# Patient Record
Sex: Female | Born: 1980 | Race: White | Hispanic: No | Marital: Married | Smoking: Former smoker
Health system: Southern US, Community
[De-identification: ages and names within clinical notes are randomized; demographics above are authoritative.]

## PROBLEM LIST (undated history)

## (undated) DIAGNOSIS — G43909 Migraine, unspecified, not intractable, without status migrainosus: Secondary | ICD-10-CM

## (undated) DIAGNOSIS — G4730 Sleep apnea, unspecified: Secondary | ICD-10-CM

## (undated) HISTORY — DX: Sleep apnea, unspecified: G47.30

## (undated) HISTORY — DX: Migraine, unspecified, not intractable, without status migrainosus: G43.909

## (undated) HISTORY — PX: INTRAUTERINE DEVICE INSERTION: SHX323

---

## 2016-07-26 ENCOUNTER — Ambulatory Visit: Payer: BC Managed Care – PPO | Attending: Obstetrics & Gynecology | Admitting: Physical Therapy

## 2016-07-26 ENCOUNTER — Encounter: Payer: Self-pay | Admitting: Physical Therapy

## 2016-07-26 DIAGNOSIS — M6281 Muscle weakness (generalized): Secondary | ICD-10-CM | POA: Diagnosis not present

## 2016-07-26 DIAGNOSIS — R279 Unspecified lack of coordination: Secondary | ICD-10-CM | POA: Insufficient documentation

## 2016-07-26 NOTE — Patient Instructions (Signed)
About Pelvic Support Problems Pelvic Support Problems Explained Ligaments, muscles, and connective tissue normally hold your bladder, uterus, and other organs in their proper places in your pelvis. When these tissues become weak, a problem with pelvic support may result. Weak support can cause one or more of the pelvic organs to drop down into the vagina. An organ may even drop so far that is partially exposed outside the body.  Pelvic support problems are named by the change in the organ. The main types of pelvic support problems are:  . Cystocele: When the bladder drops down into your vagina.  . Enterocele: When your small intestine drops between your vagina and rectum.  . Rectocele: When your rectum bulges into the vaginal wall.  Marland Kitchen Uterine prolapse: When your uterus drops into your vagina.  . Vaginal prolapse: When the top part of the vagina begins to droop. This sometimes happens after a hysterectomy (removal of the uterus).  Causes Pelvic support problems can be caused by many conditions. They may begin after you give birth, especially if you had a large baby. During childbirth, the muscles and skin of the birth canal (vagina) are stretched and sometimes torn. They heal over time but are not always exactly the same. A long pushing stage of labor may also weaken these tissues as well as very rapid births as the tissues do not have time to stretch so they tear.  Also, after menopause, there are changes in the vaginal walls resulting from a decrease in estrogen. Estrogen helps to keep the tissues toned. Low levels of estrogen weaken the vaginal walls and may cause the bladder to shift from its normal position. As women get older, the loss of muscle tone and the relaxation of muscles may cause the uterus or other organs to drop.  Over time, conditions like chronic coughing, chronic constipation, doing a lot of heavy lifting, straining to pass stool, and obesity, can also weaken the pelvic support  muscles.  Diagnosing Pelvic Support Problems Your health care provider will ask about your symptoms and do a pelvic examination. Your provider may also do a rectal exam during your pelvic exam. Your provider may ask you to: 1. Bear down and push (like you are having a bowel movement) so he or she can see if your bladder or other part of your body protrudes into the vagina. 2. Contract the muscles of your pelvis to check the strength of your pelvic muscles.  3. Do several types of urine, nerve and muscle tests of the pelvis and around the bladder to see what type of treatment is best for you.   Symptoms Symptoms of pelvic support problems depend on the organ involved, but may include:  . urine leakage  . stain or fecal loss after a bowel movement . trouble having bowel movements  . ache in the lower abdomen, groin, or lower back  . bladder infection  . a feeling of heaviness, pulling, or fullness in the pelvis, or a feeling that something is falling out of the vagina  . an organ protruding from your vaginal opening  . feeling the need to support the organs or perineal area to empty bladder or bowels . painful sexual intercourse.  Many women feel pelvic pressure or trouble holding their urine immediately after childbirth. For some, these symptoms go away permanently, in others they return as they get older.  Treatment Options A prolapsed organ cannot repair itself. Contact your health care provider as soon as you notice symptoms  of a problem. Treatment depends on what the specific problem is and how far advanced it is.  . The symptoms caused by some pelvic support problems may simply be treated with changes in diet, medicine to soften the stool, weight loss, or avoiding strenuous activities. You may also do pelvic floor exercises to help strengthen your pelvic muscles.  . Some cases of prolapse may require a special support device made from plastic or rubber called a pessary that fits into the  vagina to support the uterus, vagina, or bladder. A pessary can also help women who leak urine when coughing, straining, or exercising. In mild cases, a tampon or vaginal diaphragm may be used instead of a pessary.  Talk to your doctor or health care provider about these options. . In serious cases, surgery may be needed to put the organs back into their proper place. The uterus may be removed because of the pressure it puts on the bladder.  Your doctor will know what surgery will be best for you. How can I prevent pelvic support problems?  You can help prevent pelvic support problems by:  . maintaining a healthy lifestyle  . continuing to do pelvic floor exercises after you deliver a baby  . maintaining a healthy weight  . avoiding a lot of heavy lifting and lifting with your legs (not from your waist)  treating constipation and   About Cystocele Overview The pelvic organs, including the bladder, are normally supported by pelvic floor muscles and ligaments.   When these muscles and ligaments are stretched, weakened or torn, the wall between the bladder and the vagina sags or herniates causing a prolapse, sometimes called a cystocele. This condition may cause discomfort and problems with emptying the bladder.  It can be present in various stages.  Some people are not aware of the changes. Others may notice changes at the vaginal opening or a feeling of the bladder dropping outside the body. Causes of a Cystocele A cystocele is usually caused by muscle straining or stretching during childbirth.  In addition, cystocele is more common after menopause, because the hormone estrogen helps keep the elastic tissues around the pelvic organs strong.  A cystocele is more likely to occur when levels of estrogen decrease. Other causes include: heavy lifting, chronic coughing, previous pelvic surgery and obesity.  Symptoms A bladder that has dropped from its normal position may cause: unwanted urine leakage  (stress incontinence), frequent urination or urge to urinate, incomplete emptying of the bladder (not feeling bladder relief after emptying), pain or discomfort in the vagina, pelvis, groin, lower back or lower abdomen and frequent urinary tract infections.  Mild cases may not cause any symptoms.  Treatment Options . Pelvic floor (Kegel) exercises: Strength training the muscles in your genital area  . Behavioral changes: Treating and preventing constipation, taking time to empty your bladder properly, learning to lift properly and/or  avoid heavy lifting when possible, stopping smoking, avoiding weight gain and treating a chronic cough or bronchitis. . A pessary: A vaginal support device is sometimes used to help pelvic support caused by muscle and ligament changes. . Surgery: Aurgical repair may be necessary if symptoms cannot be managed with exercise, behavioral changes and a pessary. Surgery is usually considered for severe cases. [facility] [address], [address] [place], [place] [address] Phone # [phone] Fax [phone]

## 2016-07-26 NOTE — Therapy (Signed)
[facility] [address] [place], [place], [address] Phone: [phone]   Fax:  [phone]  Physical Therapy Evaluation  Patient Details  Name: Twisha Vanpelt MRN: [id number] Date of Birth: 15-Apr-1981 Referring Provider: Dr. Myna Hidalgo  Encounter Date: 07/26/2016      PT End of Session - 07/26/16 1107    Visit Number 1   Date for PT Re-Evaluation 11/25/16   PT Start Time 0930   PT Stop Time 1010   PT Time Calculation (min) 40 min   Activity Tolerance Patient tolerated treatment well   Behavior During Therapy [facility] for tasks assessed/performed      History reviewed. No pertinent past medical history.  History reviewed. No pertinent surgical history.  There were no vitals filed for this visit.       Subjective Assessment - 07/26/16 0941    Subjective My son born 08/01/2016.  I noticed a problem 6 months ago and things felt weird.  I looked at the vagina and saw things coming out.  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  After the fissure I had urinary urgency. I have cut out caffiene.  Not sure I have fully emptied my bladder. leak urine with sneeze and cough unexpectantly, and yell at dog.    Patient Stated Goals reduce prolapse, reduce urgency, reduce urinary leakage   Currently in Pain? No/denies            [facility] PT Assessment - 07/26/16 0001      Assessment   Medical Diagnosis N81.4 Uerine prolapse   Referring Provider Dr. Myna Hidalgo   Onset Date/Surgical Date 08/01/16   Prior Therapy None     Precautions   Precautions None     Restrictions   Weight Bearing Restrictions No     Balance Screen   Has the patient fallen in the past 6 months No   Has the patient had a decrease in activity level because of a fear of falling?  No   Is the patient reluctant to leave their home because of a fear of falling?  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, poultry, [place], dogs, cats   Leisure yoga 1 time per week     Cognition   Overall Cognitive Status Within Functional Limits for tasks assessed     Observation/Other Assessments   Focus on Therapeutic Outcomes (FOTO)  49% limitation with urinary problem  goal is 36% limitation     Posture/Postural Control   Posture/Postural Control No significant limitations     ROM / Strength   AROM / PROM / Strength AROM;Strength     AROM   Overall AROM  Within functional limits for tasks performed     Strength   Overall Strength Comments right hip abduction and left hip extension 4/5     Palpation   SI assessment  right ilium is anterior rotated; sacrum rotated left;                  Pelvic Floor Special Questions - 07/26/16 0001    Prior Pregnancies Yes   Number of Vaginal Deliveries 1   Diastasis Recti none   Currently Sexually Active Yes   Marinoff Scale no problems   Urinary Leakage Yes   Pad use 0   Activities that  cause leaking Coughing;Sneezing;Other  yelling at dog   Urinary urgency Yes   Caffeine beverages no   Falling out feeling (prolapse) Yes   Activities that cause feeling of prolapse sitting on commode, squatting   Prolapse Anterior Wall  comes to introitus   Pelvic Floor Internal Exam Patient confirms identification and approves PT to assess muscle integrity and strength   Exam Type Vaginal   Palpation tightness along bil. sides of urethra,    Strength good squeeze, good lift, able to hold agaisnt strong resistance          [facility] Adult PT Treatment/Exercise - 07/26/16 0001      Manual Therapy   Manual Therapy Muscle Energy Technique;Joint mobilization   Joint Mobilization corrected sacrum   Muscle Energy Technique correct right ilium;                 PT Education - 07/26/16 1107    Education provided Yes   Education Details information on pelvic  support issues   Person(s) Educated Patient   Methods Explanation;Handout   Comprehension Verbalized understanding          PT Short Term Goals - 07/26/16 1115      PT SHORT TERM GOAL #1   Title independent with initial HEP   Time 4   Period Weeks   Status New     PT SHORT TERM GOAL #2   Title urinary leakage with coughing and sneezing decreased >/= 25%   Time 4   Period Weeks   Status New     PT SHORT TERM GOAL #3   Title urinary leakage with yelling at her field dog decreased >/= 25%   Time 4   Period Weeks   Status New     PT SHORT TERM GOAL #4   Title understand how  valsalva manuever can strain pelvic floor and increase prolapse   Time 4   Period Weeks   Status New     PT SHORT TERM GOAL #5   Title understand ways to manage uterine prolapse to prevent progression   Time 4   Period Weeks   Status New     Additional Short Term Goals   Additional Short Term Goals Yes           PT Long Term Goals - 07/26/16 1117      PT LONG TERM GOAL #1   Title independent with HEP and how to progress herself   Time 4   Period Months   Status New     PT LONG TERM GOAL #2   Title urinary leakage with coughing and sneezing decreased >/= 80% due to increased pelvic floor strength   Time 4   Period Months   Status New     PT LONG TERM GOAL #3   Title urinary leakage with yelling at her field dog decreased >/= 80% due to increased pelvic floor strength   Time 4   Period Weeks   Status New     PT LONG TERM GOAL #4   Title feeling of prolapse reduced to minimal with squatting and sitting on commode   Time 4   Period Months   Status New     PT LONG TERM GOAL #5   Title FOTO score is </= 36% limitation   Time 4   Period Months   Status New               Plan - 07/26/16 1108  Clinical Impression Statement Patient is a 35 year old female with diagnosis of uterine prolapse that began 08/02/2015 when her son was born.  Patient reports no pain.  She  leaks urine with coughing, sneezing and yelling at her field dog.  Patient reports urinary urgency.  Patient has a prolapse just before the intoitus wne bearing down in a hookly position. Patient has decreased mobility of  bladder and urethra. Tightness located on the pubovesical ligaments.  Right ilium is rotated anterior and sacrum is rotated left.  After mobilization and muscle energy technique the pelvis was in correct alignment. Pelvic floor strength is 4/5 and right hip abduction and left hip extension strength is 4/5. Patient is of low complexity. Patient will benefit from physical therapy to improve strength and stability of the pelvis.    Rehab Potential Excellent   Clinical Impairments Affecting Rehab Potential None   PT Frequency 1x / week   PT Duration Other (comment)  4 months   PT Treatment/Interventions ADLs/Self Care Home Management;Neuromuscular re-education;Therapeutic exercise;Therapeutic activities;Patient/family education;Manual techniques   PT Next Visit Plan check on pelvic alignment, pelvic EMG in standing, core strength, discuss no valsalva movement, hip abd and ext strength; soft tissue work   PT Home Exercise Plan progress as needed   Recommended Other Services None   Consulted and Agree with Plan of Care Patient      Patient will benefit from skilled therapeutic intervention in order to improve the following deficits and impairments:  Decreased coordination, Increased fascial restricitons, Decreased endurance, Decreased activity tolerance, Decreased strength  Visit Diagnosis: Muscle weakness (generalized) - Plan: PT plan of care cert/re-cert  Unspecified lack of coordination - Plan: PT plan of care cert/re-cert     Problem List There are no active problems to display for this patient.   Eulis FosterCheryl Gray, PT 07/26/16 11:23 AM   [facility] [address] [place], [place], [address] Phone: [phone]    Fax:  [phone]  Name: Vincent GrosKaela Olarte MRN: [id number] Date of Birth: Dec 10, 1981

## 2016-08-17 ENCOUNTER — Ambulatory Visit: Payer: BC Managed Care – PPO | Admitting: Physical Therapy

## 2016-08-17 ENCOUNTER — Encounter: Payer: Self-pay | Admitting: Physical Therapy

## 2016-08-17 DIAGNOSIS — M6281 Muscle weakness (generalized): Secondary | ICD-10-CM | POA: Diagnosis not present

## 2016-08-17 DIAGNOSIS — R279 Unspecified lack of coordination: Secondary | ICD-10-CM

## 2016-08-17 NOTE — Therapy (Signed)
[name] [place], [place], [address] Phone: [phone]   Fax:  [phone]  Physical Therapy Treatment  Patient Details  Name: Margaret Rich MRN: [id number] Date of Birth: 1981-05-29 Referring Provider: Dr. Myna HidalgoJennifer Ozan  Encounter Date: 08/17/2016      PT End of Session - 08/17/16 1107    Visit Number 2   Date for PT Re-Evaluation 11/25/16   PT Start Time 1107  patient came late   PT Stop Time 1145   PT Time Calculation (min) 38 min   Activity Tolerance Patient tolerated treatment well   Behavior [facility] Therapy [facility] for tasks assessed/performed      History reviewed. No pertinent past medical history.  History reviewed. No pertinent surgical history.  There were no vitals filed for this visit.      Subjective Assessment - 08/17/16 1108    Subjective I did good for awhile.  too many carbonated beverages I leak.   Patient Stated Goals reduce prolapse, reduce urgency, reduce urinary leakage   Currently in Pain? No/denies            [facility] PT Assessment - 08/17/16 0001      Palpation   SI assessment  right ilium is anterior rotated; sacrum rotated left;                      [facility] Treatment/Exercise - 08/17/16 0001      Manual Therapy   Manual Therapy Muscle Energy Technique;Myofascial release;Soft tissue mobilization   Muscle Energy Technique correct right ilium;                 PT Education - 08/17/16 1140    Education provided Yes   Education Details abdominal contraction, pelvic floor contraction, no valsalva maneuver, lifting principles   Person(s) Educated Patient   Methods Explanation;Demonstration;Verbal cues;Handout   Comprehension Returned demonstration;Verbalized understanding          PT Short Term Goals - 08/17/16 1107      PT SHORT TERM GOAL #1   Title independent with initial HEP   Time 4   Period Weeks   Status New     PT SHORT TERM GOAL #2   Title urinary leakage with coughing and sneezing decreased >/= 25%   Time 4   Period Weeks   Status New     PT SHORT TERM GOAL #3   Title urinary leakage with yelling at her field dog decreased >/= 25%   Time 4   Period Weeks   Status New     PT SHORT TERM GOAL #4   Title understand how  valsalva manuever can strain pelvic floor and increase prolapse   Time 4   Period Weeks   Status New     PT SHORT TERM GOAL #5   Title understand ways to manage uterine prolapse to prevent progression   Time 4   Period Weeks   Status New           PT Long Term Goals - 07/26/16 1117      PT LONG TERM GOAL #1   Title independent with HEP and how to progress herself   Time 4   Period Months   Status New     PT LONG TERM GOAL #2   Title urinary leakage with coughing and sneezing decreased >/= 80% due to increased pelvic floor strength   Time 4   Period Months  Status New     PT LONG TERM GOAL #3   Title urinary leakage with yelling at her field dog decreased >/= 80% due to increased pelvic floor strength   Time 4   Period Weeks   Status New     PT LONG TERM GOAL #4   Title feeling of prolapse reduced to minimal with squatting and sitting on commode   Time 4   Period Months   Status New     PT LONG TERM GOAL #5   Title FOTO score is </= 36% limitation   Time 4   Period Months   Status New               Plan - 08/17/16 1140    Clinical Impression Statement Patient pelvis was in correct alignment after therapy. Patient is able to contract her abdominals correctly after the soft tissue work. No change in urinary leakage at this time.  Patient was educated on bladder irritants due to carbonated beverages and alchol cause urinary urgency.  Patient will benefit from skilled therapy to improve pelvic floor coordination.    Rehab Potential Excellent   Clinical Impairments Affecting Rehab Potential None   PT Frequency 1x / week   PT Duration Other  (comment)  4 months   PT Treatment/Interventions ADLs/Self Care Home Management;Neuromuscular re-education;Therapeutic exercise;Therapeutic activities;Patient/family education;Manual techniques   PT Next Visit Plan check on pelvic alignment, pelvic EMG in standing, core strength, , hip abd and ext strength; soft tissue work, review bladder diary   PT Home Exercise Plan progress as needed   Consulted and Agree with Plan of Care Patient      Patient will benefit from skilled therapeutic intervention in order to improve the following deficits and impairments:  Decreased coordination, Increased fascial restricitons, Decreased endurance, Decreased activity tolerance, Decreased strength  Visit Diagnosis: Muscle weakness (generalized)  Unspecified lack of coordination     Problem List There are no active problems to display for this patient.   Margaret Rich, PT 08/17/16 11:44 AM   [facility] [address] [place], [place], [address] Phone: [phone]   Fax:  [phone]  Name: Margaret Rich MRN: [id number] Date of Birth: 11/22/1981

## 2016-08-17 NOTE — Patient Instructions (Addendum)
Certain foods and liquids will decrease the pH making the urine more acidic.  Urinary urgency increases when the urine has a low pH.  Most common irritants: alcohol, carbonated beverages and caffinated beverages.  Foods to avoid: apple juice, apples, ascorbic acid, canteloupes, chili, citrus fruits, coffee, cranberries, grapes, guava, peaches, pepper, pineapple, plums, strawberries, tea, tomatoes, and vinegar.  Drinking plenty of water may help to increase the pH and dilute out any of the effects of specific irritants. Drink half you body weight in ounces per day.   Foods that are NOT irritating to the bladder include: Pears, papayas, sun-brewed teas, watermelons, non-citrus herbal teas, apricots, kava and low-acid instant drinks (Postum)  Adduction: Hip - Knees Together (Hook-Lying)    Lie with hips and knees bent, towel roll between knees. Push knees together. Hold for _10_ seconds. Rest for _5__ seconds. Repeat _10_ times. Do _2__ times a day.   Copyright  VHI. All rights reserved.    Quick Contraction: Gravity Resisted (Sitting)    Sitting, quickly squeeze then fully relax pelvic floor. Perform __1_ sets of _10__. Rest for _1__ seconds between sets. Do __3_ times a day.  Copyright  VHI. All rights reserved.   Slow Contraction: Gravity Resisted (Sitting)    Sitting, slowly squeeze pelvic floor for __10_ seconds. Rest for _5__ seconds. Repeat _10__ times. Do __3_ times a day.  Copyright  VHI. All rights reserved.  Lifting Principles  .Maintain proper posture and head alignment. .Slide object as close as possible before lifting. .Move obstacles out of the way. .Test before lifting; ask for help if too heavy. .Tighten stomach muscles without holding breath. .Use smooth movements; do not jerk. .Use legs to do the work, and pivot with feet. .Distribute the work load symmetrically and close to the center of trunk. .Push instead of pull whenever possible. Do not hold  your breath Copyright  VHI. All rights reserved.  Housework - Vacuuming    Hold the vacuum with arm held at side. Step back and forth to move it, keeping head up. Avoid twisting.   Copyright  VHI. All rights reserved.  [facility] [address], [address] [place], [place] [address] Phone # [phone] Fax [phone]

## 2016-08-24 ENCOUNTER — Encounter: Payer: BC Managed Care – PPO | Admitting: Physical Therapy

## 2016-08-31 ENCOUNTER — Ambulatory Visit: Payer: BC Managed Care – PPO | Attending: Obstetrics & Gynecology | Admitting: Physical Therapy

## 2016-08-31 DIAGNOSIS — M6281 Muscle weakness (generalized): Secondary | ICD-10-CM | POA: Diagnosis not present

## 2016-08-31 DIAGNOSIS — R279 Unspecified lack of coordination: Secondary | ICD-10-CM | POA: Insufficient documentation

## 2016-08-31 NOTE — Therapy (Signed)
[facility] [address], [address] [place], [place], [address] Phone: [phone]   Fax:  [phone]  Physical Therapy Treatment  Patient Details  Name: Vincent GrosKaela Dominy MRN: [id number] Date of Birth: 1981/09/20 Referring Provider: Dr. Myna HidalgoJennifer Ozan  Encounter Date: 08/31/2016      PT End of Session - 08/31/16 1111    Visit Number 3   Date for PT Re-Evaluation 11/25/16   PT Start Time 1111  came 10 min late   PT Stop Time 1145   PT Time Calculation (min) 34 min   Activity Tolerance Patient tolerated treatment well   Behavior During Therapy [facility] for tasks assessed/performed      No past medical history on file.  No past surgical history on file.  There were no vitals filed for this visit.      Subjective Assessment - 08/31/16 1112    Subjective no urinary leakage since last visit.  NO sure about the prolapse if it is better or not. My stomach has changed shape for the good.  I felt better after the internal work.    Patient Stated Goals reduce prolapse, reduce urgency, reduce urinary leakage   Currently in Pain? No/denies                      Pelvic Floor Special Questions - 08/31/16 0001    Pelvic Floor Internal Exam Patient confirms identification and approves PT to assess muscle integrity and strength   Exam Type Vaginal           [facility] Adult PT Treatment/Exercise - 08/31/16 0001      Self-Care   Self-Care Other Self-Care Comments   Other Self-Care Comments  discussed bladder diary and discussed irritants and going to the bathroom every 2-3 hours.      Therapeutic Activites    Therapeutic Activities Other Therapeutic Activities   Other Therapeutic Activities no bearing down with      Manual Therapy   Manual Therapy Internal Pelvic Floor;Myofascial release   Myofascial Release urethra compressor bil. upward glide of bladder, release of urethra    Internal Pelvic Floor soft tissue to bil.  obturator internist , bil. coccygeus, bil. puborectalis                PT Education - 08/31/16 1155    Education provided Yes   Education Details no valsalva manuever with strenous activity   Person(s) Educated Patient   Methods Explanation   Comprehension Verbalized understanding          PT Short Term Goals - 08/31/16 1112      PT SHORT TERM GOAL #1   Title independent with initial HEP   Time 4   Period Weeks   Status Achieved     PT SHORT TERM GOAL #2   Title urinary leakage with coughing and sneezing decreased >/= 25%   Time 4   Period Weeks   Status Achieved     PT SHORT TERM GOAL #3   Title urinary leakage with yelling at her field dog decreased >/= 25%   Time 4   Period Weeks   Status Achieved     PT SHORT TERM GOAL #4   Title understand how  valsalva manuever can strain pelvic floor and increase prolapse   Time 4   Period Weeks   Status Achieved     PT SHORT TERM GOAL #5   Title understand ways to manage uterine prolapse to prevent  progression   Time 4   Period Weeks   Status Achieved           PT Long Term Goals - 07/26/16 1117      PT LONG TERM GOAL #1   Title independent with HEP and how to progress herself   Time 4   Period Months   Status New     PT LONG TERM GOAL #2   Title urinary leakage with coughing and sneezing decreased >/= 80% due to increased pelvic floor strength   Time 4   Period Months   Status New     PT LONG TERM GOAL #3   Title urinary leakage with yelling at her field dog decreased >/= 80% due to increased pelvic floor strength   Time 4   Period Weeks   Status New     PT LONG TERM GOAL #4   Title feeling of prolapse reduced to minimal with squatting and sitting on commode   Time 4   Period Months   Status New     PT LONG TERM GOAL #5   Title FOTO score is </= 36% limitation   Time 4   Period Months   Status New               Plan - 08/31/16 1228    Clinical Impression Statement Patient  will bear down when she laughs. Patient had tightness located on bil. sides of the bladder and on the urethra increased wrinkled instead of smoothness. Patient has no urinary leakage.  She feel her prolapse has not changed yet.  Patient understands about bladder irritants and not urinating before 2-3 hours.  Patient is able to control the urge to urinate and wait for another hour.  Patient will benefit from skilled therapy to improve tissue mobility to reduce the prolapse.   Rehab Potential Excellent   Clinical Impairments Affecting Rehab Potential None   PT Frequency 1x / week   PT Duration Other (comment)  4 months   PT Treatment/Interventions ADLs/Self Care Home Management;Neuromuscular re-education;Therapeutic exercise;Therapeutic activities;Patient/family education;Manual techniques   PT Next Visit Plan check on pelvic alignment, pelvic EMG in standing, core strength, , hip abd and ext strength; soft tissue work, lifting technique   PT Home Exercise Plan progress as needed   Consulted and Agree with Plan of Care Patient      Patient will benefit from skilled therapeutic intervention in order to improve the following deficits and impairments:  Decreased coordination, Increased fascial restricitons, Decreased endurance, Decreased activity tolerance, Decreased strength  Visit Diagnosis: Muscle weakness (generalized)  Unspecified lack of coordination     Problem List There are no active problems to display for this patient.   [name], PT 08/31/16 12:31 PM   [facility] [address] [place], [place], [address] Phone: [phone]   Fax:  [phone]  Name: Aydee Mcnew MRN: [id number] Date of Birth: 23-Jul-1981

## 2016-09-07 ENCOUNTER — Encounter: Payer: BC Managed Care – PPO | Admitting: Physical Therapy

## 2016-09-12 ENCOUNTER — Ambulatory Visit: Payer: BC Managed Care – PPO | Admitting: Physical Therapy

## 2016-09-12 ENCOUNTER — Encounter: Payer: Self-pay | Admitting: Physical Therapy

## 2016-09-12 DIAGNOSIS — M6281 Muscle weakness (generalized): Secondary | ICD-10-CM | POA: Diagnosis not present

## 2016-09-12 DIAGNOSIS — R279 Unspecified lack of coordination: Secondary | ICD-10-CM

## 2016-09-12 NOTE — Therapy (Addendum)
[facility] [address] [place], [place], [address] Phone: [phone]   Fax:  [phone]  Physical Therapy Treatment  Patient Details  Name: Margaret Rich MRN: [id number] Date of Birth: [birth year] Referring Provider: Dr. Janyth Pupa  Encounter Date: 09/12/2016      PT End of Session - 09/12/16 1410    Visit Number 4   Date for PT Re-Evaluation 11/25/16   PT Start Time 7672   PT Stop Time 1445   PT Time Calculation (min) 40 min   Activity Tolerance Patient tolerated treatment well   Behavior During Therapy [facility] for tasks assessed/performed      History reviewed. No pertinent past medical history.  History reviewed. No pertinent surgical history.  There were no vitals filed for this visit.      Subjective Assessment - 09/12/16 1408    Subjective since last visit propase was 80% better and today is 40% better.  The urinary leakage was worse but could be due to the sneezing and coughing.    Patient Stated Goals reduce prolapse, reduce urgency, reduce urinary leakage   Currently in Pain? No/denies            [facility] PT Assessment - 09/12/16 0001      Palpation   SI assessment  right ilium is anterior rotated; sacrum rotated left;    Palpation comment decreased movement of right hip;                   Pelvic Floor Special Questions - 09/12/16 0001    Pelvic Floor Internal Exam Patient confirms identification and approves PT to assess muscle integrity and strength   Exam Type Vaginal   Strength strong squeeze, against strong resistance           [facility] Adult PT Treatment/Exercise - 09/12/16 0001      Manual Therapy   Manual Therapy Soft tissue mobilization;Myofascial release;Internal Pelvic Floor   Joint Mobilization sacrum with upward and downward glide bil. sides in prone.    Internal Pelvic Floor soft tissue work to right levator ani and obturator internist with righ thip movement;  release of the middle pelvic floor layer with placing fingers on both side vulva and releasing planes of fascia;   afterwards Pelvic floor strength increased to 5/5   Muscle Energy Technique correct right ilium in left sidely;                 PT Education - 09/12/16 1442    Education provided Yes   Education Details cough with pelvic floor exercise   Person(s) Educated Patient   Methods Explanation;Demonstration;Handout   Comprehension Returned demonstration;Verbalized understanding          PT Short Term Goals - 08/31/16 1112      PT SHORT TERM GOAL #1   Title independent with initial HEP   Time 4   Period Weeks   Status Achieved     PT SHORT TERM GOAL #2   Title urinary leakage with coughing and sneezing decreased >/= 25%   Time 4   Period Weeks   Status Achieved     PT SHORT TERM GOAL #3   Title urinary leakage with yelling at her field dog decreased >/= 25%   Time 4   Period Weeks   Status Achieved     PT SHORT TERM GOAL #4   Title understand how  valsalva manuever can strain pelvic floor and increase prolapse   Time  4   Period Weeks   Status Achieved     PT SHORT TERM GOAL #5   Title understand ways to manage uterine prolapse to prevent progression   Time 4   Period Weeks   Status Achieved           PT Long Term Goals - 09/12/16 1409      PT LONG TERM GOAL #1   Title independent with HEP and how to progress herself   Time 4   Period Months   Status New     PT LONG TERM GOAL #2   Title urinary leakage with coughing and sneezing decreased >/= 80% due to increased pelvic floor strength   Period Months   Status On-going  no change since last visit     PT LONG TERM GOAL #3   Title urinary leakage with yelling at her field dog decreased >/= 80% due to increased pelvic floor strength   Time 4   Period Weeks   Status On-going  no change since last visit     PT LONG TERM GOAL #4   Title feeling of prolapse reduced to minimal with  squatting and sitting on commode   Time 4   Period Months   Status On-going  40% better     PT LONG TERM GOAL #5   Title FOTO score is </= 36% limitation   Time 4   Period Months   Status New               Plan - 09/12/16 1445    Clinical Impression Statement Patient reports the prolpase is 40% better.  Her urinary leakage has been worse due to having a cold and coughing more therefore more urinary leakage.  Patient has trigger points in right levator ani and obturator internist that was reduced after soft tissue work.  Pelvic floor strength increased to 5/5 but will bear down with coughing and sneezing. Patient will benefit from skilled therapy to reduce prolapse and increase pelvic floor strength.    Rehab Potential Excellent   Clinical Impairments Affecting Rehab Potential None   PT Frequency 1x / week   PT Duration Other (comment)  4 months   PT Treatment/Interventions ADLs/Self Care Home Management;Neuromuscular re-education;Therapeutic exercise;Therapeutic activities;Patient/family education;Manual techniques   PT Next Visit Plan pelvic EMG in standing, core strength, , hip abd and ext strength; soft tissue work, lifting technique   PT Home Exercise Plan progress as needed   Consulted and Agree with Plan of Care Patient      Patient will benefit from skilled therapeutic intervention in order to improve the following deficits and impairments:  Decreased coordination, Increased fascial restricitons, Decreased endurance, Decreased activity tolerance, Decreased strength  Visit Diagnosis: Muscle weakness (generalized)  Unspecified lack of coordination     Problem List There are no active problems to display for this patient.   Earlie Counts, PT 09/12/16 2:48 PM   [facility] [address], [place], [place], [address] Phone: [phone]   Fax:  [phone]  Name: Margaret Rich MRN: [id number] Date  of Birth: [birth year]  PHYSICAL THERAPY DISCHARGE SUMMARY  Visits from Start of Care: 4  Current functional level related to goals / functional outcomes: See above. Patient has not returned since her last visit.    Remaining deficits: See above.    Education / Equipment: HEP Plan:  Patient goals were not met. Patient is being discharged due to not returning since the last visit.  Thank you for the referral. Earlie Counts, PT 11/14/16 9:36 AM  ?????

## 2016-09-12 NOTE — Patient Instructions (Addendum)
  Cough: Phase 4 (Sitting)    Squeeze pelvic floor and hold. Inhale. Lean shoulders forward. Cough repeatedly. Relax. Repeat _5__ times. Do _2__ times a day.  Copyright  VHI. All rights reserved.  [facility] [address], [address] [place], [place] [address] Phone # [phone] Fax [phone]

## 2016-09-14 ENCOUNTER — Encounter: Payer: BC Managed Care – PPO | Admitting: Physical Therapy

## 2016-09-28 ENCOUNTER — Encounter: Payer: BC Managed Care – PPO | Admitting: Physical Therapy

## 2016-10-12 ENCOUNTER — Encounter: Payer: BC Managed Care – PPO | Admitting: Physical Therapy

## 2017-08-07 ENCOUNTER — Ambulatory Visit: Payer: BC Managed Care – PPO | Admitting: Endocrinology

## 2018-04-09 ENCOUNTER — Inpatient Hospital Stay (HOSPITAL_COMMUNITY)
Admission: AD | Admit: 2018-04-09 | Payer: BC Managed Care – PPO | Source: Ambulatory Visit | Admitting: Obstetrics and Gynecology

## 2018-07-31 ENCOUNTER — Ambulatory Visit: Payer: BC Managed Care – PPO | Admitting: Family

## 2018-07-31 ENCOUNTER — Encounter: Payer: Self-pay | Admitting: Family

## 2018-07-31 ENCOUNTER — Encounter

## 2018-07-31 VITALS — BP 118/72 | HR 92 | Temp 98.6°F | Ht 67.5 in | Wt 181.2 lb

## 2018-07-31 DIAGNOSIS — S93401A Sprain of unspecified ligament of right ankle, initial encounter: Secondary | ICD-10-CM

## 2018-07-31 DIAGNOSIS — G43909 Migraine, unspecified, not intractable, without status migrainosus: Secondary | ICD-10-CM | POA: Insufficient documentation

## 2018-07-31 DIAGNOSIS — N390 Urinary tract infection, site not specified: Secondary | ICD-10-CM | POA: Insufficient documentation

## 2018-07-31 DIAGNOSIS — R51 Headache: Secondary | ICD-10-CM

## 2018-07-31 DIAGNOSIS — I10 Essential (primary) hypertension: Secondary | ICD-10-CM | POA: Insufficient documentation

## 2018-07-31 DIAGNOSIS — K219 Gastro-esophageal reflux disease without esophagitis: Secondary | ICD-10-CM | POA: Insufficient documentation

## 2018-07-31 DIAGNOSIS — R32 Unspecified urinary incontinence: Secondary | ICD-10-CM | POA: Insufficient documentation

## 2018-07-31 DIAGNOSIS — R519 Headache, unspecified: Secondary | ICD-10-CM | POA: Insufficient documentation

## 2018-07-31 NOTE — Progress Notes (Signed)
  Margaret Rich is a 37 y.o. female with the following history as recorded in Epic[employer]:  Patient Active Problem List   Diagnosis Date Noted  . Frequent headaches 07/31/2018  . GERD (gastroesophageal reflux disease) 07/31/2018  . High blood pressure 07/31/2018  . Migraines 07/31/2018  . Urine incontinence 07/31/2018  . Urinary tract infection 07/31/2018    Current Outpatient Medications  Medication Sig Dispense Refill  . cholecalciferol (VITAMIN D) 1000 units tablet Take 1,000 Units by mouth 2 (two) times daily.    Marland Kitchen. CRANBERRY PO Take by mouth.    . Magnesium 70 MG CAPS Take by mouth.    . Probiotic Product (PROBIOTIC DAILY PO) Take by mouth.    . vitamin C (ASCORBIC ACID) 500 MG tablet Take 500 mg by mouth daily.     No current facility-administered medications for this visit.     Allergies: Patient has no allergy information on record.  History reviewed. No pertinent past medical history.  History reviewed. No pertinent surgical history.  History reviewed. No pertinent family history.  Social History   Tobacco Use  . Smoking status: Former Smoker    Years: 5.00    Last attempt to quit: 05/18/2008    Years since quitting: 10.2  . Smokeless tobacco: Never Used  . Tobacco comment: Social smoker;   Substance Use Topics  . Alcohol use: Not on file    Subjective:  Patient presents today as a new patient- needs to establish care; is 3 months post-partum- doing well/ breast-feeding; Participating in a post-partum study at [employer]- 3x a week; Does have a diastasis recti- planning to see pelvic floor specialist; Will be seeing her GYN for pap smear in the next few months; Injured her right ankle approximately 2 months ago- still concerned about prominent swelling; able to bear weight, no numbness/ tingling; has been using OTC brace with some benefit.    Objective:  Vitals:   07/31/18 0918  BP: 118/72  Pulse: 92  Temp: 98.6 F (37 C)  TempSrc: Oral  SpO2: 97%  Weight: 181 lb 3.2  oz (82.2 kg)  Height: 5' 7.5" (1.715 m)    General: Well developed, well nourished, in no acute distress  Skin : Warm and dry.  Head: Normocephalic and atraumatic  Lungs: Respirations unlabored;  Musculoskeletal: No deformities; swelling noted over right lateral ankle Extremities: No edema, cyanosis, clubbing  Vessels: Symmetric bilaterally  Neurologic: Alert and oriented; speech intact; face symmetrical; moves all extremities well; CNII-XII intact without focal deficit   Assessment:  1. Moderate right ankle sprain, initial encounter     Plan:  Suspect slower healing to due breast-feeding/ post-partum status; will continue to have her wear her brace and rest as much as possible; if swelling still present in 1 month, call back and will refer to sports medicine.   GYN manages majority of preventive healthcare needs/ will follow-up here as needed otherwise.   No follow-ups on file.  No orders of the defined types were placed in this encounter.   Requested Prescriptions    No prescriptions requested or ordered in this encounter

## 2018-09-19 ENCOUNTER — Encounter: Payer: Self-pay | Admitting: Family

## 2018-09-19 ENCOUNTER — Encounter: Payer: Self-pay | Admitting: Gastroenterology

## 2018-09-19 ENCOUNTER — Other Ambulatory Visit: Payer: Self-pay | Admitting: Family

## 2018-09-19 DIAGNOSIS — K602 Anal fissure, unspecified: Secondary | ICD-10-CM

## 2018-09-23 ENCOUNTER — Encounter: Payer: Self-pay | Admitting: Gastroenterology

## 2018-09-23 ENCOUNTER — Ambulatory Visit: Payer: BC Managed Care – PPO | Admitting: Gastroenterology

## 2018-09-23 VITALS — BP 120/62 | HR 102 | Ht 67.5 in | Wt 172.2 lb

## 2018-09-23 DIAGNOSIS — K6289 Other specified diseases of anus and rectum: Secondary | ICD-10-CM

## 2018-09-23 DIAGNOSIS — K625 Hemorrhage of anus and rectum: Secondary | ICD-10-CM

## 2018-09-23 DIAGNOSIS — K602 Anal fissure, unspecified: Secondary | ICD-10-CM | POA: Diagnosis not present

## 2018-09-23 MED ORDER — AMBULATORY NON FORMULARY MEDICATION: 4 refills | Status: DC

## 2018-09-23 NOTE — Patient Instructions (Signed)
We have given you a prescription for the following medications to your pharmacy for you to pick up at your convenience:  Nifedipine  Use Colace 1-2 times daily  Use Fibercon 1 tab daily

## 2018-09-23 NOTE — Progress Notes (Signed)
GASTROENTEROLOGY OUTPATIENT CLINIC VISIT   Primary Care Provider Olive Bass, FNP [address] [place] [address] [phone]  Referring Provider Olive Bass, FNP [address] [place], [place] [address] [phone]  Patient Profile: Margaret Rich is a 37 y.o. 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  The patient presents to the [facility] for an evaluation and management of problem(s) noted below:  Problem List 1. Anal pain   2. Anal fissure   3. Rectal bleeding     History of Present Illness: This is the patient's first visit to the [facility] [place] [facility].  She is accompanied by her 66-month-old daughter.  The patient describes that within the last 2 to 3 weeks she had recently developed issues with mastitis.  She developed the need for nonsteroidals as well as antibiotics.  After the antibiotics were initiated she developed issues of abdominal discomfort, bloating, pain and subsequently had development of diarrhea.  As result of the multiple bowel movements she was experiencing she was using the restroom frequently.  She began to have significant and severe anal/rectal pain after her diarrheal bowel movements were occurring.  She did notice blood occurring at times when she would defecate.  The pain is sharp, and intense in quality it does not radiate to anywhere within the upper abdomen.  Over the course last few weeks the diarrhea has subsided however her rectal/anal pain persists.  In 2015 she was diagnosed with a previous anal fissure and was treated per her report with nifedipine twice daily however it took almost a year of therapy to be completed before she was completely well.  She had some of this medications to lying around but it has not been significantly effective for her.  She does not use stool softeners.  She does not use fiber.  She does not use  laxatives.  She has never had a flexible sigmoidoscopy but does describe what seems to be an exam under anesthesia.  GI Review of Systems Positive as above Negative for dysphasia, odynophagia, abdominal pain currently, nausea, vomiting, jaundice, melena  Review of Systems General: Denies fevers/chills HEENT: Denies oral lesions Cardiovascular: Denies chest pain Pulmonary: Denies shortness of breath Gastroenterological: See HPI Genitourinary: Denies darkened urine Hematological: Denies easy bruising/bleeding Endocrine: Denies temperature intolerance Psychological: Mood is stable Musculoskeletal: Denies new arthralgias   Medications Current Outpatient Medications  Medication Sig Dispense Refill  . cholecalciferol (VITAMIN D) 1000 units tablet Take 1,000 Units by mouth 2 (two) times daily.    Marland Kitchen CRANBERRY PO Take by mouth.    . Magnesium 70 MG CAPS Take by mouth.    . Probiotic Product (PROBIOTIC DAILY PO) Take by mouth.    . vitamin C (ASCORBIC ACID) 500 MG tablet Take 500 mg by mouth daily.    . AMBULATORY NON FORMULARY MEDICATION Medication Name: Nifedipin 0.3% ointment; apply 2-4 times a day 1 Tube 4   No current facility-administered medications for this visit.     Allergies No Known Allergies  Histories History reviewed. No pertinent past medical history. History reviewed. No pertinent surgical history. Social History   Socioeconomic History  . Marital status: Married    Spouse name: Not on file  . Number of children: Not on file  . Years of education: Not on file  . Highest education level: Not on file  Occupational History  . Not on file  Social Needs  . Financial  resource strain: Not on file  . Food insecurity:    Worry: Not on file    Inability: Not on file  . Transportation needs:    Medical: Not on file    Non-medical: Not on file  Tobacco Use  . Smoking status: Former Smoker    Years: 5.00    Last attempt to quit: 05/18/2008    Years since quitting:  10.3  . Smokeless tobacco: Never Used  . Tobacco comment: Social smoker;   Substance and Sexual Activity  . Alcohol use: Not on file  . Drug use: Not on file  . Sexual activity: Not on file  Lifestyle  . Physical activity:    Days per week: Not on file    Minutes per session: Not on file  . Stress: Not on file  Relationships  . Social connections:    Talks on phone: Not on file    Gets together: Not on file    Attends religious service: Not on file    Active member of club or organization: Not on file    Attends meetings of clubs or organizations: Not on file    Relationship status: Not on file  . Intimate partner violence:    Fear of current or ex partner: Not on file    Emotionally abused: Not on file    Physically abused: Not on file    Forced sexual activity: Not on file  Other Topics Concern  . Not on file  Social History Narrative  . Not on file   Family History  Problem Relation Age of Onset  . Alcohol abuse Father   . Cancer Father   . Alcohol abuse Brother   . Cancer Maternal Grandmother   . Diabetes Maternal Grandmother   . High blood pressure Maternal Grandmother   . Alcohol abuse Maternal Grandfather   . Heart disease Maternal Grandfather   . High Cholesterol Maternal Grandfather   . High blood pressure Maternal Grandfather   . Alcohol abuse Paternal Grandmother   . Alcohol abuse Paternal Grandfather   . Cancer Paternal Grandfather   . Colon cancer Neg Hx   . Esophageal cancer Neg Hx   . Inflammatory bowel disease Neg Hx   . Liver disease Neg Hx   . Pancreatic cancer Neg Hx   . Rectal cancer Neg Hx   . Stomach cancer Neg Hx    I have reviewed her medical, social, and family history in detail and updated the electronic medical record as necessary.    PHYSICAL EXAMINATION  BP 120/62   Pulse (!) 102   Ht 5' 7.5" (1.715 m)   Wt 172 lb 3.2 oz (78.1 kg)   SpO2 98%   BMI 26.57 kg/m  Wt Readings from Last 3 Encounters:  09/23/18 172 lb 3.2 oz (78.1  kg)  07/31/18 181 lb 3.2 oz (82.2 kg)  GEN: NAD, appears stated age, doesn't appear chronically ill PSYCH: Cooperative, without pressured speech EYE: Conjunctivae pink, sclerae anicteric ENT: MMM, without oral ulcers, no erythema or exudates noted NECK: Supple CV: RR without R/Gs  RESP: CTAB posteriorly GI: NABS, soft, NT/ND, without rebound or guarding, no HSM appreciated GU: Perineal exam shows evidence of small external hemorrhoids; DRE with significant pain in the posterior 4-8 o'clock position upon palpation, no overt fissure is noted on DRE, and patient has normal perineal descent upon finger expulsion (she is unable to tolerate an anoscopy to further evaluate the region however) MSK/EXT: Trace bilateral lower extremity edema  SKIN: No jaundice NEURO:  Alert & Oriented x 3, no focal deficits   REVIEW OF DATA  I reviewed the following data at the time of this encounter:  GI Procedures and Studies  No relevant studies  Laboratory Studies  Reviewed in Epic She describes recent labs being drawn clinic visit but we do not have access to those however she does not want to have labs drawn today  Imaging Studies  No relevant studies   ASSESSMENT  Ms. Brunetti is a 37 y.o. 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  The patient is seen today for evaluation and management of:  1. Anal pain   2. Anal fissure   3. Rectal bleeding    The patient's clinical history examination is most concerning for an anal fissure.  Unable to perform a formal endoscopy is utilized to the fissure however based on the patient's prior history is reasonable to treat her with a course the next 4 to 8 weeks as if this is an anal fissure.  Discussed the role of trying to soften her stool with Colace which can be used once daily as well as helping maintain stool adequacy with MiraLAX.  However she is concerned about having too much  diarrhea as this was what she felt caused her to develop a fissure the first place and thus she wants to hold on MiraLAX.  We also discussed the use of fiber supplementation with FiberCon.  We will prescribe her compounded nifedipine she can use 2-4 times daily and she was instructed on the current means of being treated with that medication.  She will also try to do sitz baths as well.  Dependent on how she does over the course of the next few weeks she will reach out to [place] to let us know if she is doing better if not we will consider further therapies for use of nitroglycerin ointment.  She may require a flexible sigmoidoscopy or colonoscopy to ensure nothing else is occurring if she continues to have issues/pain/bleeding that is not responding to fissural treatment.  She defers on blood work today but will let us know if she continues to have significant blood loss in her bowel movements.  All patient questions were answered, to the best of my ability, and the patient agrees to the aforementioned plan of action with follow-up as indicated.   PLAN  1. Anal pain - We will treat as an anal fissure - Begin nifedipine ointment 2-4 times daily for next 8 weeks and will continue therapy thereafter based on how she does - He fails to improve we will consider endoscopic evaluation after 6 to 8 weeks - But we will begin fiber supplementation with FiberCon over-the-counter daily - Would begin Colace once to twice daily over-the-counter - Holding on MiraLAX currently because of diarrhea being an issue for her initially but will consider use in future  2. Anal fissure  3. Rectal bleeding - We will defer on CBC at this point per patient request however will consider to have bleeding   No orders of the defined types were placed in this encounter.   New Prescriptions   AMBULATORY NON FORMULARY MEDICATION    Medication Name: Nifedipin 0.3% ointment; apply 2-4 times a day   Modified Medications   No  medications on file    Planned Follow Up: No follow-ups on file.   Corliss Parish, MD  Gastroenterology Advanced Endoscopy Office #  [phone]  

## 2018-09-24 ENCOUNTER — Encounter: Payer: Self-pay | Admitting: Gastroenterology

## 2018-09-24 DIAGNOSIS — K625 Hemorrhage of anus and rectum: Secondary | ICD-10-CM | POA: Insufficient documentation

## 2018-09-24 DIAGNOSIS — K6289 Other specified diseases of anus and rectum: Secondary | ICD-10-CM | POA: Insufficient documentation

## 2018-09-24 DIAGNOSIS — K602 Anal fissure, unspecified: Secondary | ICD-10-CM | POA: Insufficient documentation

## 2018-10-28 ENCOUNTER — Ambulatory Visit: Payer: BC Managed Care – PPO | Admitting: Gastroenterology

## 2018-11-26 ENCOUNTER — Encounter: Payer: Self-pay | Admitting: Gastroenterology

## 2018-11-26 NOTE — Telephone Encounter (Signed)
Error

## 2018-12-02 ENCOUNTER — Telehealth: Payer: Self-pay

## 2018-12-02 ENCOUNTER — Ambulatory Visit: Payer: BC Managed Care – PPO | Admitting: Physician Assistant

## 2018-12-02 ENCOUNTER — Encounter: Payer: Self-pay | Admitting: Physician Assistant

## 2018-12-02 VITALS — BP 102/64 | HR 100 | Ht 66.83 in | Wt 169.2 lb

## 2018-12-02 DIAGNOSIS — K644 Residual hemorrhoidal skin tags: Secondary | ICD-10-CM | POA: Diagnosis not present

## 2018-12-02 DIAGNOSIS — K6289 Other specified diseases of anus and rectum: Secondary | ICD-10-CM | POA: Diagnosis not present

## 2018-12-02 MED ORDER — HYDROCORTISONE 1 % EX OINT: 1.0000 "application " | TOPICAL_OINTMENT | Freq: Two times a day (BID) | CUTANEOUS | 0 refills | Status: DC

## 2018-12-02 MED ORDER — PEG 3350-KCL-NA BICARB-NACL 420 G PO SOLR: 4000.0000 mL | Freq: Once | ORAL | 0 refills | Status: AC

## 2018-12-02 NOTE — Progress Notes (Signed)
Agree with assessment and plan. I agree with trying to use RectiCare with lidocaine and/or the need for nitroglycerin ointment to be offered to the patient. Okay to continue nifedipine for now. I would proceed with a full colonoscopy rather than just a flexible sigmoidoscopy so she will be transition to a full colonoscopy with preparation (I have placed our advanced RN to work on rescheduling as a full colonoscopy).

## 2018-12-02 NOTE — Telephone Encounter (Signed)
-----   Message from Lemar LoftyGabriel Mansouraty Jr., MD sent at 12/02/2018  4:21 PM EST -----   ----- Message ----- From: Unk LightningLemmon, Jennifer Lynne, PA Sent: 12/02/2018   9:48 AM EST To: Lemar LoftyGabriel Mansouraty Jr., MD

## 2018-12-02 NOTE — Patient Instructions (Signed)
If you are age 39 or older, your body mass index should be between 23-30. Your Body mass index is 26.64 kg/m. If this is out of the aforementioned range listed, please consider follow up with your Primary Care Provider.  If you are age 39 or younger, your body mass index should be between 19-25. Your Body mass index is 26.64 kg/m. If this is out of the aformentioned range listed, please consider follow up with your Primary Care Provider.   You have been scheduled for a flexible sigmoidoscopy. Please follow the written instructions given to you at your visit today. If you use inhalers (even only as needed), please bring them with you on the day of your procedure.  We have sent the following medications to your pharmacy for you to pick up at your convenience: Hydrocortisone ointment  Continue Nifedipine twice daily.  Buy over-the-counter Recticare w/Lidocaine.  Apply as needed (before bowel movement if able)   How to Take a Sitz Bath A sitz bath is a warm water bath that is taken while you are sitting down. The water should only come up to your hips and should cover your buttocks. Your health care provider may recommend a sitz bath to help you:  Clean the lower part of your body, including your genital area.  With itching.  With pain.  With sore muscles or muscles that tighten or spasm.  How to take a sitz bath Take 3-4 sitz baths per day or as told by your health care provider. 1. Partially fill a bathtub with warm water. You will only need the water to be deep enough to cover your hips and buttocks when you are sitting in it. 2. If your health care provider told you to put medicine in the water, follow the directions exactly. 3. Sit in the water and open the tub drain a little. 4. Turn on the warm water again to keep the tub at the correct level. Keep the water running constantly. 5. Soak in the water for 15-20 minutes or as told by your health care provider. 6. After the sitz  bath, pat the affected area dry first. Do not rub it. 7. Be careful when you stand up after the sitz bath because you may feel dizzy.  Contact a health care provider if:  Your symptoms get worse. Do not continue with sitz baths if your symptoms get worse.  You have new symptoms. Do not continue with sitz baths until you talk with your health care provider. This information is not intended to replace advice given to you by your health care provider. Make sure you discuss any questions you have with your health care provider. Document Released: 08/26/2004 Document Revised: 05/03/2016 Document Reviewed: 12/02/2014 Elsevier Interactive Patient Education  2018 [employer]  Thank you for choosing me and [facility].  Hyacinth MeekerJennifer Lemmon, PA-C

## 2018-12-02 NOTE — Telephone Encounter (Signed)
Agree with assessment and plan. I agree with trying to use RectiCare with lidocaine and/or the need for nitroglycerin ointment to be offered to the patient. Okay to continue nifedipine for now. I would proceed with a full colonoscopy rather than just a flexible sigmoidoscopy so she will be transition to a full colonoscopy with preparation (I have placed our advanced RN to work on rescheduling as a full colonoscopy).   Appt changed to colon, prep sent to the pharmacy.  Will call pt and go over instructions  Instructions available in My Chart.

## 2018-12-02 NOTE — Progress Notes (Signed)
Chief Complaint: Follow-up rectal pain and bleeding  HPI:    Margaret Rich is a 37 year old female, known to Dr. Meridee Score, who returns to clinic today for follow-up of rectal pain and bleeding.    09/23/2018 office visit with Dr. Meridee Score described 2 to 3 weeks of issues with mastitis with her 61-month-old daughter.  Apparently had antibiotics which caused abdominal discomfort, bloating, pain and subsequently diarrhea.  Describes severe rectal pain after her diarrheal bowel movements.  As well as some blood.  Apparently previously treated for a anal fissure in 2015.  However it took almost a year of twice daily Nifedipine to feel well.  Patient was started on Nifedipine ointment 2-4 times daily for 8 weeks and it was discussed that if she failed to improve would consider endoscopic evaluation after 6 to 8 weeks.  She was started on fiber and Colace once to twice daily.    Today, the patient tells me that her anal pain is no better.  This is worse with a bowel movement which she tells me tends to make her even more "tense".  Patient describes that she has been applying the nifedipine twice a day since seeing Dr. Meridee Score but feels like she has a new spot of tenderness near the back of her rectum.  Also continues to describe some rectal bleeding.  Her stools have returned to soft solid noted as a 4 on the Bristol stool scale and she is not using the fiber supplement but does continue the Colace 2 tabs at night.    Social history positive for having 2 children at home a 36-year-old and a 51-month-old whom she is still breast-feeding.  This makes having time for a bowel movement hard.    Denies fever, chills or abdominal pain.  History reviewed. No pertinent past medical history.  History reviewed. No pertinent surgical history.  Current Outpatient Medications  Medication Sig Dispense Refill  . AMBULATORY NON FORMULARY MEDICATION Medication Name: Nifedipin 0.3% ointment; apply 2-4 times a day 1 Tube  4  . cholecalciferol (VITAMIN D) 1000 units tablet Take 1,000 Units by mouth 2 (two) times daily.    Marland Kitchen CRANBERRY PO Take by mouth.    . Magnesium 70 MG CAPS Take by mouth.    . Probiotic Product (PROBIOTIC DAILY PO) Take by mouth.    . vitamin C (ASCORBIC ACID) 500 MG tablet Take 500 mg by mouth daily.     No current facility-administered medications for this visit.     Allergies as of 12/02/2018  . (No Known Allergies)    Family History  Problem Relation Age of Onset  . Alcohol abuse Father   . Cancer Father   . Alcohol abuse Brother   . Cancer Maternal Grandmother   . Diabetes Maternal Grandmother   . High blood pressure Maternal Grandmother   . Alcohol abuse Maternal Grandfather   . Heart disease Maternal Grandfather   . High Cholesterol Maternal Grandfather   . High blood pressure Maternal Grandfather   . Alcohol abuse Paternal Grandmother   . Alcohol abuse Paternal Grandfather   . Cancer Paternal Grandfather   . Colon cancer Neg Hx   . Esophageal cancer Neg Hx   . Inflammatory bowel disease Neg Hx   . Liver disease Neg Hx   . Pancreatic cancer Neg Hx   . Rectal cancer Neg Hx   . Stomach cancer Neg Hx     Social History   Socioeconomic History  . Marital status: Married  Spouse name: Not on file  . Number of children: Not on file  . Years of education: Not on file  . Highest education level: Not on file  Occupational History  . Not on file  Social Needs  . Financial resource strain: Not on file  . Food insecurity:    Worry: Not on file    Inability: Not on file  . Transportation needs:    Medical: Not on file    Non-medical: Not on file  Tobacco Use  . Smoking status: Former Smoker    Years: 5.00    Last attempt to quit: 05/18/2008    Years since quitting: 10.5  . Smokeless tobacco: Never Used  . Tobacco comment: Social smoker;   Substance and Sexual Activity  . Alcohol use: Not on file  . Drug use: Not on file  . Sexual activity: Not on file    Lifestyle  . Physical activity:    Days per week: Not on file    Minutes per session: Not on file  . Stress: Not on file  Relationships  . Social connections:    Talks on phone: Not on file    Gets together: Not on file    Attends religious service: Not on file    Active member of club or organization: Not on file    Attends meetings of clubs or organizations: Not on file    Relationship status: Not on file  . Intimate partner violence:    Fear of current or ex partner: Not on file    Emotionally abused: Not on file    Physically abused: Not on file    Forced sexual activity: Not on file  Other Topics Concern  . Not on file  Social History Narrative  . Not on file    Review of Systems:    Constitutional: No weight loss, fever or chills Cardiovascular: No chest pain Respiratory: No SOB  Gastrointestinal: See HPI and otherwise negative   Physical Exam:  Vital signs: BP 102/64 (BP Location: Left Arm, Patient Position: Sitting, Cuff Size: Normal)   Pulse 100   Ht 5' 6.83" (1.697 m) Comment: height measured without shoes  Wt 169 lb 4 oz (76.8 kg)   LMP 06/17/2017   BMI 26.64 kg/m   Constitutional:   Pleasant Caucasian female appears to be in NAD, Well developed, Well nourished, alert and cooperative Respiratory: Respirations even and unlabored. Lungs clear to auscultation bilaterally.   No wheezes, crackles, or rhonchi.  Cardiovascular: Normal S1, S2. No MRG. Regular rate and rhythm. No peripheral edema, cyanosis or pallor.  Gastrointestinal:  Soft, nondistended, nontender. No rebound or guarding. Normal bowel sounds. No appreciable masses or hepatomegaly. Rectal:  External: tense sphincter tone, visible thrombosed external hemorrhoid ttp and hard, unable to visualize inside of sphincter due to tenseness and pain Psychiatric: Demonstrates good judgement and reason without abnormal affect or behaviors.  No recent labs or imaging.  Assessment: 1.  Anal pain: Initially  thought from a fissure diagnosed in October, no change over the past 2 months per the patient applying Nifedipine twice daily, could not visualize actual fissure today only thrombosed hemorrhoid but patient tells me she still feels pain on the side of her rectum where this was before; question underlying IBD versus other complication 2.  Thrombosed external hemorrhoid: Tender and hard to palpation today, it has been there over the past week at least and would likely not benefit from I&D  Plan: 1.  Would recommend the  patient start Hydrocortisone ointment twice daily to the outside of her rectum to the area of hardness and question of thrombosed hemorrhoid.  Prescribed this. 2.  Continue Nifedipine twice daily applied with a Q-tip per patient. 3.  Continue Colace 2 tabs daily 4.  Discussed with patient that she can buy over-the-counter RectiCare cream with Lidocaine and apply as needed for pain, sometimes applied before bowel movement can help with pain as well 5.  Recommend patient start sitz bath's 10-15 minutes to 2-3 times a day as much as she can with 2 small children.  Discussed buying the sitz bath which can fit in her toilet bowl to make this more convenient for her. 6.  Scheduled patient for flex sigmoidoscopy with Dr. Meridee Score in the [facility].  Did discuss risks, benefits, limitations and alternatives and patient agrees to proceed.  Will discuss with anesthesia so the patient knows what to do as far as breast-feeding after time of procedure. 7.  Patient to follow clinic per recommendations from Dr. Meridee Score after time of procedure.  Did discuss the possibility of a surgery referral in the future if this is necessary.  Hyacinth Meeker, PA-C [facility] 12/02/2018, 9:05 AM  Cc: Olive Bass,*

## 2018-12-03 ENCOUNTER — Telehealth: Payer: Self-pay | Admitting: Physician Assistant

## 2018-12-03 ENCOUNTER — Encounter: Payer: Self-pay | Admitting: Gastroenterology

## 2018-12-03 DIAGNOSIS — K6289 Other specified diseases of anus and rectum: Secondary | ICD-10-CM

## 2018-12-03 DIAGNOSIS — K625 Hemorrhage of anus and rectum: Secondary | ICD-10-CM

## 2018-12-03 DIAGNOSIS — K602 Anal fissure, unspecified: Secondary | ICD-10-CM

## 2018-12-03 NOTE — Addendum Note (Signed)
Addended by: Loretha StaplerPHELPS, Kenley Troop L on: 12/03/2018 03:26 PM   Modules accepted: Orders

## 2018-12-03 NOTE — Telephone Encounter (Signed)
Per verbal order from Hyacinth MeekerJennifer Lemmon okay to switch colon back to Flex and order MRI pelvis. Victorino DikeJennifer will speak with Dr Meridee ScoreMansouraty about flex being un-sedated.    Anal pain, possible abscess, rectal bleeding.    [facility]. Your appointment time is 1 pm. Please arrive 30 minutes prior to your appointment time for registration purposes. Please make certain not to have anything to eat or drink 6 hours prior to your test. In addition, if you have any metal in your body, have a pacemaker or defibrillator, please be sure to let your ordering physician know. This test typically takes 45 minutes to 1 hour to complete. Should you need to reschedule, please call [phone] to do so.

## 2018-12-03 NOTE — Telephone Encounter (Signed)
I think if you had concern for possible abscess or fluid collection MRI or CT would be reasonable.  You can move forward with ordering and I think it is reasonable for MRI, though the insurance may want CT first. In regards to Flex vs Colon, I think most reasonable to just run the colon and ensure no other issues. Let me know if you are OK with this plan as well Victorino DikeJennifer. GM

## 2018-12-03 NOTE — Telephone Encounter (Signed)
Patient had multiple questions in regards to MRI pelvis with contrast which has been ordered as well as the flex sig which was changed into a possible colonoscopy.    I explained the reasoning behind an MRI of the pelvis to ensure there is not an abscess.  She is okay with proceeding with this as long as it is done this year for insurance purposes.  Discussed that Dr. Meridee ScoreMansouraty wanted to do a full colonoscopy instead of just a flex sig.  The patient is worried about being under anesthesia as she does not have childcare that day and will have to have someone just watch her kids for a few hours.  She would prefer to do an unsedated flex sig and colonoscopy in the future only if necessary.  I will discuss this with Dr. Meridee ScoreMansouraty.  She would like to continue with her scheduled time of December 24.  Patient encouraged to call us back if she has any further questions or concerns.  Margaret MeekerJennifer Salaam [place], PA-C

## 2018-12-03 NOTE — Telephone Encounter (Signed)
I have communicated with the pt via My Chart.  See messages.

## 2018-12-03 NOTE — Telephone Encounter (Signed)
Pt called in with some concerns about the testing that is being order for her and wanting to speak to the nurse to get an understanding why the add. Testing is being ordered.

## 2018-12-05 ENCOUNTER — Telehealth: Payer: Self-pay

## 2018-12-05 ENCOUNTER — Telehealth: Payer: Self-pay | Admitting: Gastroenterology

## 2018-12-05 DIAGNOSIS — K602 Anal fissure, unspecified: Secondary | ICD-10-CM

## 2018-12-05 DIAGNOSIS — K644 Residual hemorrhoidal skin tags: Secondary | ICD-10-CM

## 2018-12-05 DIAGNOSIS — K625 Hemorrhage of anus and rectum: Secondary | ICD-10-CM

## 2018-12-05 DIAGNOSIS — K6289 Other specified diseases of anus and rectum: Secondary | ICD-10-CM

## 2018-12-05 NOTE — Telephone Encounter (Signed)
See alternate note pt has been scheduled for 12/23 at 945 am.  [facility] prior to that appt

## 2018-12-05 NOTE — Telephone Encounter (Signed)
-----   Message from Libby MawAmy L Hazelwood sent at 12/05/2018 11:54 AM EST ----- Regarding: RE: MR Pelvis Thank you! I called and got auth for CT.  Also called Eathan Groman to get pt set up. Thanks for the help! Amy ----- Message ----- From: Unk LightningLemmon, Jennifer Lynne, PA Sent: 12/05/2018   9:29 AM EST To: Libby MawAmy L Hazelwood Subject: RE: MR Pelvis                                  Dr. Meridee ScoreMansouraty thought they may want CT instead. It is ok to just order CT pelvis with contrast. Thanks-JLL ----- Message ----- From: Libby MawHazelwood, Amy L Sent: 12/04/2018   3:00 PM EST To: Unk LightningJennifer Lynne Lemmon, PA Subject: MR Pelvis                                      Redgie GrayerHey Jennifer, This pt's case has gone peer to peer for the MR Pelvis.  They are questioning why not do CT.  I have sent records.  Maybe you can call to get things straight over the phone... [phone] Select physician prompt Pt ID# [id number] set up under Dr. Elesa HackerMansouraty's name I appreciate any help with this.   Thanks, Amy

## 2018-12-05 NOTE — Telephone Encounter (Signed)
You have been scheduled for a CT scan of the abdomen and pelvis at [facility] ([address]---[facility]   You are scheduled on 12/23/18 at 2:30 pm. You should arrive 30 minutes prior to your appointment time for registration. Please follow the written instructions below on the day of your exam:

## 2018-12-05 NOTE — Telephone Encounter (Signed)
CT [facility] 12/09/18 at 9:45 am 1 bottle 2 hours prior NPO 4  Hours prior will call if something sooner.    The patient has been notified of this information and all questions answered.   The pt has been advised of the information and verbalized understanding.

## 2018-12-05 NOTE — Telephone Encounter (Signed)
Pt returning call

## 2018-12-09 ENCOUNTER — Ambulatory Visit (HOSPITAL_COMMUNITY): Admission: RE | Admit: 2018-12-09 | Payer: BC Managed Care – PPO | Source: Ambulatory Visit

## 2018-12-09 ENCOUNTER — Telehealth: Payer: Self-pay | Admitting: Gastroenterology

## 2018-12-09 ENCOUNTER — Ambulatory Visit (INDEPENDENT_AMBULATORY_CARE_PROVIDER_SITE_OTHER)
Admission: RE | Admit: 2018-12-09 | Discharge: 2018-12-09 | Disposition: A | Discharge status: Home / Self Care | Payer: BC Managed Care – PPO | Source: Ambulatory Visit | Attending: Gastroenterology | Admitting: Gastroenterology

## 2018-12-09 DIAGNOSIS — K6289 Other specified diseases of anus and rectum: Secondary | ICD-10-CM

## 2018-12-09 DIAGNOSIS — K625 Hemorrhage of anus and rectum: Secondary | ICD-10-CM

## 2018-12-09 DIAGNOSIS — K644 Residual hemorrhoidal skin tags: Secondary | ICD-10-CM | POA: Diagnosis not present

## 2018-12-09 DIAGNOSIS — K602 Anal fissure, unspecified: Secondary | ICD-10-CM

## 2018-12-09 MED ORDER — IOPAMIDOL (ISOVUE-300) INJECTION 61%
100.0000 mL | Freq: Once | INTRAVENOUS | Status: AC | PRN
  Administered 2018-12-09: 100 mL via INTRAVENOUS

## 2018-12-09 NOTE — Telephone Encounter (Signed)
Pt called to confirm that flexsig scheduled for tomorrow will be done without sedation. She stated to have mentioned this before scheduling procedure. Pls call her.

## 2018-12-09 NOTE — Telephone Encounter (Signed)
Called patient back and she explains that she does not want sedation during her Flex sig tomorrow. Patient states that she has 3 young kids and had them naturally and does not think this procedure is warranted for sedation. She also explains that she has a lot to do tomorrow and does not want to have to worry about sedation wearing off. I explained that she would still need IV access but that she can do this without sedation and if that was the case she would not need a care partner because she would be able to drive herself home.

## 2018-12-10 ENCOUNTER — Ambulatory Visit (AMBULATORY_SURGERY_CENTER): Payer: BC Managed Care – PPO | Admitting: Gastroenterology

## 2018-12-10 ENCOUNTER — Encounter: Payer: Self-pay | Admitting: Gastroenterology

## 2018-12-10 VITALS — BP 110/63 | HR 69 | Temp 99.3°F | Resp 16 | Ht 67.0 in | Wt 169.0 lb

## 2018-12-10 DIAGNOSIS — K625 Hemorrhage of anus and rectum: Secondary | ICD-10-CM

## 2018-12-10 DIAGNOSIS — K649 Unspecified hemorrhoids: Secondary | ICD-10-CM | POA: Diagnosis not present

## 2018-12-10 DIAGNOSIS — K6289 Other specified diseases of anus and rectum: Secondary | ICD-10-CM

## 2018-12-10 MED ORDER — SODIUM CHLORIDE 0.9 % IV SOLN: 500.0000 mL | Freq: Once | INTRAVENOUS | Status: DC

## 2018-12-10 NOTE — Patient Instructions (Signed)
See post procedure report for recommendations.   YOU HAD AN ENDOSCOPIC PROCEDURE TODAY AT THE [facility]:   Refer to the procedure report that was given to you for any specific questions about what was found during the examination.  If the procedure report does not answer your questions, please call your gastroenterologist to clarify.  If you requested that your care partner not be given the details of your procedure findings, then the procedure report has been included in a sealed envelope for you to review at your convenience later.  YOU SHOULD EXPECT: Some feelings of bloating in the abdomen. Passage of more gas than usual.  Walking can help get rid of the air that was put into your GI tract during the procedure and reduce the bloating. If you had a lower endoscopy (such as a colonoscopy or flexible sigmoidoscopy) you may notice spotting of blood in your stool or on the toilet paper. If you underwent a bowel prep for your procedure, you may not have a normal bowel movement for a few days.  Please Note:  You might notice some irritation and congestion in your nose or some drainage.  This is from the oxygen used during your procedure.  There is no need for concern and it should clear up in a day or so.  SYMPTOMS TO REPORT IMMEDIATELY:   Following lower endoscopy (colonoscopy or flexible sigmoidoscopy):  Excessive amounts of blood in the stool  Significant tenderness or worsening of abdominal pains  Swelling of the abdomen that is new, acute  Fever of 100F or higher   For urgent or emergent issues, a gastroenterologist can be reached at any hour by calling [phone].   DIET:  We do recommend a small meal at first, but then you may proceed to your regular diet.  Drink plenty of fluids but you should avoid alcoholic beverages for 24 hours.  ACTIVITY:  You should plan to take it easy for the rest of today and you should NOT DRIVE or use heavy machinery until tomorrow (because  of the sedation medicines used during the test).    FOLLOW UP: Our staff will call the number listed on your records the next business day following your procedure to check on you and address any questions or concerns that you may have regarding the information given to you following your procedure. If we do not reach you, we will leave a message.  However, if you are feeling well and you are not experiencing any problems, there is no need to return our call.  We will assume that you have returned to your regular daily activities without incident.  If any biopsies were taken you will be contacted by phone or by letter within the next 1-3 weeks.  Please call us at [phone] if you have not heard about the biopsies in 3 weeks.    SIGNATURES/CONFIDENTIALITY: You and/or your care partner have signed paperwork which will be entered into your electronic medical record.  These signatures attest to the fact that that the information above on your After Visit Summary has been reviewed and is understood.  Full responsibility of the confidentiality of this discharge information lies with you and/or your care-partner.

## 2018-12-10 NOTE — Progress Notes (Signed)
Called to room to assist during endoscopic procedure.  Patient ID and intended procedure confirmed with present staff. Received instructions for my participation in the procedure from the performing physician.  

## 2018-12-10 NOTE — Progress Notes (Signed)
Report given to PACU, vss 

## 2018-12-10 NOTE — Addendum Note (Signed)
Addended by: Miliana Gangwer S on: 12/10/2018 10:59 AM   Modules accepted: Orders  

## 2018-12-10 NOTE — Progress Notes (Addendum)
No sedation used for procedure, patient ready for discharge and able to ambulate herself out.  No further questions.

## 2018-12-10 NOTE — Op Note (Signed)
[place] Patient Name: Margaret Rich Procedure Date: 12/10/2018 9:11 AM MRN: [id number] Endoscopist: Justice Britain , MD Age: 37 Referring MD:  Date of Birth: [birth year] Gender: Female Account #: [id number] Procedure:                Flexible Sigmoidoscopy Indications:              Internal hemorrhoids, Anal pain Medicines:                None Procedure:                Pre-Anesthesia Assessment:                           - Prior to the procedure, a History and Physical                            was performed, and patient medications and                            allergies were reviewed. The patient's tolerance of                            previous anesthesia was also reviewed. The risks                            and benefits of the procedure and the sedation                            options and risks were discussed with the patient.                            All questions were answered, and informed consent                            was obtained. Prior Anticoagulants: The patient has                            taken no previous anticoagulant or antiplatelet                            agents. ASA Grade Assessment: I - A normal, healthy                            patient. After reviewing the risks and benefits,                            the patient was deemed in satisfactory condition to                            undergo the procedure.                           After obtaining informed consent, the scope was  passed under direct vision. The Endoscope was                            introduced through the anus and advanced to the the                            descending colon. The flexible sigmoidoscopy was                            accomplished without difficulty. The patient                            tolerated the procedure. Scope In: 9:26:33 AM Scope Out: 9:37:55 AM Total Procedure Duration: 0 hours 11 minutes 22 seconds   Findings:                 Skin tags were found on perianal exam.                           The digital rectal exam findings include                            non-thrombosed internal hemorrhoids. Pertinent                            negatives include no palpable rectal lesions.                           A small amount of stool was found in the sigmoid                            colon and in the descending colon, without                            interference to visualization. Lavage of the area                            was performed using copious amounts, resulting in                            clearance with adequate visualization.                           Normal mucosa was found in the entire visualized                            colon.                           Normal mucosa was found in the rectum. Biopsies                            were taken with a cold forceps for histology to  rule out proctitis.                           Non-bleeding non-thrombosed internal hemorrhoids                            were found during retroflexion, during perianal                            exam and during digital exam. The hemorrhoids were                            Grade II (internal hemorrhoids that prolapse but                            reduce spontaneously). Complications:            No immediate complications. Estimated Blood Loss:     Estimated blood loss was minimal. Impression:               - Perianal skin tags found on perianal exam.                           - Non-thrombosed internal hemorrhoids found on                            digital rectal exam.                           - Stool in the sigmoid colon and in the descending                            colon.                           - Normal mucosa in the entire examined colon.                           - Normal mucosa in the rectum. Biopsied to rule out                            proctitis.                            - Non-bleeding non-thrombosed internal hemorrhoids. Recommendation:           - The patient will be observed post-procedure,                            until all discharge criteria are met.                           - Discharge patient to home.                           - Await pathology results.                           -  Continue current regimen of Sitz Baths, Fiber                            supplementation, Colace, Recta-care.                           - If patient has recurrent issues in the future,                            could consider possible Internal Hemorrhoidal                            banding, though less often is effective for pain                            but if she has maximized her stools then we may                            consider in future.                           - The findings and recommendations were discussed                            with the patient. Justice Britain, MD 12/10/2018 9:46:12 AM

## 2018-12-12 ENCOUNTER — Telehealth: Payer: Self-pay | Admitting: *Deleted

## 2018-12-12 NOTE — Telephone Encounter (Signed)
  Follow up Call-  Call back number 12/10/2018  Post procedure Call Back phone  # [phone]  Permission to leave phone message Yes     Patient questions:  Do you have a fever, pain , or abdominal swelling? No. Pain Score  0 *  Have you tolerated food without any problems? Yes.    Have you been able to return to your normal activities? Yes.    Do you have any questions about your discharge instructions: Diet   No. Medications  No. Follow up visit  No.  Do you have questions or concerns about your Care? No.  Actions: * If pain score is 4 or above: No action needed, pain <4.

## 2018-12-21 ENCOUNTER — Encounter: Payer: Self-pay | Admitting: Gastroenterology

## 2018-12-23 ENCOUNTER — Other Ambulatory Visit: Payer: BC Managed Care – PPO

## 2019-01-13 ENCOUNTER — Telehealth: Payer: Self-pay

## 2019-01-13 NOTE — Telephone Encounter (Signed)
01/17/19 at 845 am appt with Dr Meridee Score pt notified

## 2019-01-13 NOTE — Telephone Encounter (Signed)
-----   Message from [facility], [place] sent at 01/13/2019  9:45 AM EST ----- Regarding: Appt Pt needs appt with me or Dr. Meridee Score in the next 1-2 weeks for rectal pain ? Abscess  Thanks-JLL

## 2019-01-17 ENCOUNTER — Ambulatory Visit: Payer: BC Managed Care – PPO | Admitting: Gastroenterology

## 2019-02-12 ENCOUNTER — Encounter: Payer: Self-pay | Admitting: Physician Assistant

## 2019-02-12 ENCOUNTER — Ambulatory Visit: Payer: BC Managed Care – PPO | Admitting: Physician Assistant

## 2019-02-12 VITALS — BP 108/60 | HR 94 | Ht 68.0 in | Wt 171.4 lb

## 2019-02-12 DIAGNOSIS — K611 Rectal abscess: Secondary | ICD-10-CM | POA: Diagnosis not present

## 2019-02-12 MED ORDER — AMOXICILLIN-POT CLAVULANATE 500-125 MG PO TABS: 1.0000 | ORAL_TABLET | Freq: Three times a day (TID) | ORAL | 0 refills | Status: DC

## 2019-02-12 NOTE — Progress Notes (Signed)
Chief Complaint: Follow-up rectal pain  HPI:    [name] is a 38 year old Caucasian female, known to Dr. Meridee Rich, who returns to clinic today for follow-up of rectal pain.    12/02/2018 patient seen in office by me and was no better, continuing with rectal pain which was worse with a bowel movement as well as a "new spot of tenderness near the back of her rectum" and some rectal bleeding.  At that time patient was started on Hydrocortisone ointment twice daily, continued on Nifedipine twice daily and Colace 2 tabs daily for fissure.  She was also to buy RectiCare with Lidocaine over-the-counter for pain.  Discussed sitz bath's.  Scheduled for flex sigmoidoscopy with Dr. Meridee Rich.    12/09/2018 CT [facility] with contrast was negative with no evidence of perianal abscess or other significant abnormality.    12/10/2018 flex sig with perianal skin tags, nonthrombosed internal hemorrhoids, stool in the sigmoid colon and in the descending colon, normal mucosa in the entire examined colon and normal mucosa in the rectum as well as nonbleeding nonthrombosed internal hemorrhoids.  At that time discussed possible hemorrhoid banding if she continued with problems.    Today, the patient presents to clinic and explains that she has continued with almost the same symptoms but her rectal discomfort is mostly relieved, her largest complaint today is that she has noticed "leaking" of a purulent material throughout the day from her rectum.  Tells me if she wipes back there she can get some of this discharge on the toilet paper.  Often uses a diaper sprayer to wash off at home, so tries to avoid wiping initially after a bowel movement.  Again the pain is better but she continues with this discharge.  Also complains of feeling "hard lump back there".  Occasionally this discharge is accompanied by some pinkish tinge/bright red blood.    Denies fever, chills or change in bowel habits.  History reviewed. No pertinent  past medical history.  History reviewed. No pertinent surgical history.  Current Outpatient Medications  Medication Sig Dispense Refill  . cholecalciferol (VITAMIN D) 1000 units tablet Take 1,000 Units by mouth 2 (two) times daily.    Marland Kitchen CRANBERRY PO Take by mouth.    . IRON, FERROUS SULFATE, PO Take 10 mg by mouth daily.    . Magnesium 70 MG CAPS Take 3 capsules by mouth daily.     . Probiotic Product (PROBIOTIC DAILY PO) Take by mouth.    . vitamin C (ASCORBIC ACID) 500 MG tablet Take 500 mg by mouth daily.    . AMBULATORY NON FORMULARY MEDICATION Medication Name: Nifedipin 0.3% ointment; apply 2-4 times a day (Patient not taking: Reported on 02/12/2019) 1 Tube 4  . hydrocortisone 1 % ointment Apply 1 application topically 2 (two) times daily. Apply to outside of rectum for 2 weeks. (Patient not taking: Reported on 12/10/2018) 30 g 0   No current facility-administered medications for this visit.     Allergies as of 02/12/2019  . (No Known Allergies)    Family History  Problem Relation Age of Onset  . Alcohol abuse Father   . Cancer Father   . Alcohol abuse Brother   . Cancer Maternal Grandmother   . Diabetes Maternal Grandmother   . High blood pressure Maternal Grandmother   . Alcohol abuse Maternal Grandfather   . Heart disease Maternal Grandfather   . High Cholesterol Maternal Grandfather   . High blood pressure Maternal Grandfather   . Alcohol abuse  Paternal Grandmother   . Alcohol abuse Paternal Grandfather   . Cancer Paternal Grandfather   . Colon cancer Neg Hx   . Esophageal cancer Neg Hx   . Inflammatory bowel disease Neg Hx   . Liver disease Neg Hx   . Pancreatic cancer Neg Hx   . Rectal cancer Neg Hx   . Stomach cancer Neg Hx     Social History   Socioeconomic History  . Marital status: Married    Spouse name: Not on file  . Number of children: 2  . Years of education: Not on file  . Highest education level: Not on file  Occupational History  . Not on  file  Social Needs  . Financial resource strain: Not on file  . Food insecurity:    Worry: Not on file    Inability: Not on file  . Transportation needs:    Medical: Not on file    Non-medical: Not on file  Tobacco Use  . Smoking status: Former Smoker    Years: 5.00    Last attempt to quit: 05/18/2008    Years since quitting: 10.7  . Smokeless tobacco: Never Used  . Tobacco comment: Social smoker;   Substance and Sexual Activity  . Alcohol use: Yes  . Drug use: Never  . Sexual activity: Not on file  Lifestyle  . Physical activity:    Days per week: Not on file    Minutes per session: Not on file  . Stress: Not on file  Relationships  . Social connections:    Talks on phone: Not on file    Gets together: Not on file    Attends religious service: Not on file    Active member of club or organization: Not on file    Attends meetings of clubs or organizations: Not on file    Relationship status: Not on file  . Intimate partner violence:    Fear of current or ex partner: Not on file    Emotionally abused: Not on file    Physically abused: Not on file    Forced sexual activity: Not on file  Other Topics Concern  . Not on file  Social History Narrative  . Not on file    Review of Systems:    Constitutional: No weight loss, fever or chills Cardiovascular: No chest pain Respiratory: No SOB  Gastrointestinal: See HPI and otherwise negative   Physical Exam:  Vital signs: BP 108/60   Pulse 94   Ht 5\' 8"  (1.727 m)   Wt 171 lb 6 oz (77.7 kg)   BMI 26.06 kg/m   Constitutional:   Pleasant Caucasian female appears to be in NAD, Well developed, Well nourished, alert and cooperative Respiratory: Respirations even and unlabored. Lungs clear to auscultation bilaterally.   No wheezes, crackles, or rhonchi.  Cardiovascular: Normal S1, S2. No MRG. Regular rate and rhythm. No peripheral edema, cyanosis or pallor.  Gastrointestinal:  Soft, nondistended, nontender. No rebound or  guarding. Normal bowel sounds. No appreciable masses or hepatomegaly. Rectal:  External: palpated indurated area which expresses purulent material towards posterior rectum; Attempted anoscopy with minimal visualization given increased sphincter tone, did identify another area of purulent drainage which appeared deeper into rectum and superior to exterior drainage tract, ?fistula vs abscess Psychiatric: Demonstrates good judgement and reason without abnormal affect or behaviors.  Assessment: 1. Rectal Abscess: Seen at time of rectal exam today, question abscess +/- fistulous tract, unable to visualize inside of rectum due to  increased sphincter tone, previous CT pelvis with contrast with no abscess, recent flex sig with internal hemorrhoids and otherwise normal  Plan: 1. Patient is breastfeeding, due to restrictions of Cipro/Flagyl, prescribed Augmentin 500/125 mg q 8hrs x7d #21 2.  Discussed with patient that we would recommend an MRI of the pelvis with contrast for further evaluation of possible fistulous tract given that she may have recurrence of symptoms after treating with antibiotics, but patient tells me that she cannot afford this today.  Explained that if her symptoms return after antibiotics above this would be the recommendation. 3.  Continue sitz bath's and try to keep the area clean 4.  Did discuss case with Dr. Rhea Belton who also performed external rectal exam 5.  Patient to call our clinic as needed for an appointment in the future with Dr. Meridee Rich or myself.  Hyacinth Meeker, PA-C [facility] 02/12/2019, 10:39 AM  Cc: Olive Bass,*

## 2019-02-12 NOTE — Progress Notes (Signed)
Agree with assessment and plan per PA Lemmon.  I appreciate Dr. Lauro Franklin evaluation as well.  Agree with concern for possible abscess or fluctuance that neck step in evaluation would be a pelvic MRI.  Consideration of a referral to colorectal surgery to further evaluate things may be reasonable as well and that can be offered to the patient if she likes.

## 2019-02-12 NOTE — Patient Instructions (Signed)
If you are age 40 or older, your body mass index should be between 23-30. Your Body mass index is 26.06 kg/m. If this is out of the aforementioned range listed, please consider follow up with your Primary Care Provider.  If you are age 32 or younger, your body mass index should be between 19-25. Your Body mass index is 26.06 kg/m. If this is out of the aformentioned range listed, please consider follow up with your Primary Care Provider.   We have sent the following medications to your pharmacy for you to pick up at your convenience: Augmentin 500/125 : Take every 8 hours for 7 days  Thank you for entrusting me with your care and for choosing [facility], Hyacinth Meeker, [place]

## 2019-03-03 ENCOUNTER — Other Ambulatory Visit: Payer: Self-pay

## 2019-03-03 MED ORDER — AMOXICILLIN-POT CLAVULANATE 500-125 MG PO TABS: 1.0000 | ORAL_TABLET | Freq: Three times a day (TID) | ORAL | 0 refills | Status: AC

## 2019-03-03 NOTE — Telephone Encounter (Signed)
Can give refill of Augmentin if she feels the same problems are back. If they do not go away after this though, would recommend pelvic MRI as per Dr. Meridee Score and would need to discuss referral to surgery for further eval. Sitx baths will also continue to help.  Thanks-JLL

## 2019-07-31 ENCOUNTER — Encounter: Payer: Self-pay | Admitting: Physical Therapy

## 2019-07-31 ENCOUNTER — Ambulatory Visit: Payer: BC Managed Care – PPO | Attending: Obstetrics and Gynecology | Admitting: Physical Therapy

## 2019-07-31 ENCOUNTER — Other Ambulatory Visit: Payer: Self-pay

## 2019-07-31 DIAGNOSIS — K6289 Other specified diseases of anus and rectum: Secondary | ICD-10-CM | POA: Insufficient documentation

## 2019-07-31 DIAGNOSIS — N393 Stress incontinence (female) (male): Secondary | ICD-10-CM | POA: Diagnosis present

## 2019-07-31 DIAGNOSIS — R252 Cramp and spasm: Secondary | ICD-10-CM | POA: Diagnosis present

## 2019-07-31 DIAGNOSIS — M6281 Muscle weakness (generalized): Secondary | ICD-10-CM | POA: Insufficient documentation

## 2019-07-31 DIAGNOSIS — R278 Other lack of coordination: Secondary | ICD-10-CM

## 2019-07-31 NOTE — Therapy (Signed)
[facility] [address], [address] [place], [place], [address] Phone: [phone]   Fax:  [phone]  Physical Therapy Evaluation  Patient Details  Name: Margaret Rich MRN: [id number] Date of Birth: Mar 03, 1981 Referring Provider (PT): Karena AddisonMeredith C. Sigmon, CNM   Encounter Date: 07/31/2019  PT End of Session - 07/31/19 1243    Visit Number  1    Date for PT Re-Evaluation  10/23/19    Authorization Type  BCBS    PT Start Time  1145    PT Stop Time  1230    PT Time Calculation (min)  45 min    Activity Tolerance  Patient tolerated treatment well;No increased pain    Behavior During Therapy  [facility] for tasks assessed/performed       History reviewed. No pertinent past medical history.  History reviewed. No pertinent surgical history.  There were no vitals filed for this visit.   Subjective Assessment - 07/31/19 1152    Subjective  Patient has stress incontinence with coughing, sneezing, strain to lift. Urinating intercourse and during orgasm. No pain with intercourse. Patient feels like there is a prolapse when she is squatting and when go to the bathroom. History of fissures in anus. Patient will have a fissure 1 time per month. I have a hard nodule where the fissure was. Uses a menstraul cycle cup and when sits down while it hangs out.    Patient Stated Goals  reduce the urinary continence, improve pelvic floor tone, reduce anal pain    Currently in Pain?  Yes    Pain Score  8     Pain Location  Rectum    Pain Orientation  Mid    Pain Descriptors / Indicators  Sharp;Radiating   tearing   Pain Type  Chronic pain    Pain Onset  More than a month ago    Pain Frequency  Intermittent    Aggravating Factors   bowel movement, fecal leakage when flare-up    Pain Relieving Factors  clean the area, epson salt soak bath, rectal care    Multiple Pain Sites  No         [facility] PT Assessment - 07/31/19 0001      Assessment   Medical Diagnosis  N39.3 SUI/Stress urinary Incontinence with Sneesing and Lifting    Referring Provider (PT)  Karena AddisonMeredith C. Sigmon, CNM    Onset Date/Surgical Date  08/18/28    Prior Therapy  yes for prolapse      Precautions   Precautions  None      Restrictions   Weight Bearing Restrictions  No      Balance Screen   Has the patient fallen in the past 6 months  No    Has the patient had a decrease in activity level because of a fear of falling?   No    Is the patient reluctant to leave their home because of a fear of falling?   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, walking, lifting feed      Cognition   Overall Cognitive Status  Within Functional Limits for tasks assessed      Posture/Postural Control   Posture/Postural  Control  No significant limitations      ROM / Strength   AROM / PROM / Strength  AROM;PROM;Strength      AROM   Lumbar - Right Side Bend  decreased by 25%    Lumbar - Left Rotation  decreased by 25%      Strength   Right Hip Flexion  4+/5    Right Hip External Rotation   4/5    Right Hip ABduction  4+/5    Left Hip Flexion  4+/5    Left Hip External Rotation  4/5    Left Hip ABduction  4+/5      Palpation   SI assessment   right ilium is rotated anteriorly                Objective measurements completed on examination: See above findings.    Pelvic Floor Special Questions - 07/31/19 0001    Prior Pregnancies  Yes    Number of Pregnancies  2    Number of Vaginal Deliveries  2    Diastasis Recti  1 finger width below umbilicus    Currently Sexually Active  Yes    Is this Painful  No    Urinary Leakage  Yes    Pad use  no    Activities that cause leaking  Sneezing;Coughing;Intercourse    Urinary urgency  Yes    Urinary frequency   always feel like she has to go to the bathroom, 30 minutes    Caffeine beverages  none    External Perineal Exam  whitehead looking area posterior ot anus    External Palpation  tightness posterior of the anus    Prolapse  Anterior Wall   slight   Pelvic Floor Internal Exam  Patient confirms identification and approves PT to assess the pelvic floor and treatment    Exam Type  Vaginal;Rectal    Palpation  rectal 4/5, vaginal 2/5 with difficulty on the right side; internal anal region with thickness posterior               PT Education - 07/31/19 1243    Education Details  using coconut oil to the outside perineum and anal area    Person(s) Educated  Patient    Methods  Explanation    Comprehension  Verbalized understanding       PT Short Term Goals - 07/31/19 1418      PT SHORT TERM GOAL #1   Title  independent with initial HEP    Time  4    Period  Weeks    Status  New    Target Date  08/28/19      PT SHORT TERM GOAL #2   Title  urinary leakage with coughing and sneezing decreased >/= 25%    Time  4    Period  Weeks    Status  New    Target Date  08/28/19      PT SHORT TERM GOAL #3   Title  urinary leakage with intercourse decreased >/= 25%    Time  4    Period  Weeks    Status  New    Target Date  08/28/19      PT SHORT TERM GOAL #4   Title  understand how  valsalva manuever can strain pelvic floor and increase prolapse and increase urinary leakage with intercourse    Time  4    Period  Weeks    Status  New  Target Date  08/28/19      PT SHORT TERM GOAL #5   Title  understand ways to manage uterine prolapse to prevent progression    Time  4    Period  Weeks    Status  New    Target Date  08/28/19        PT Long Term Goals - 07/31/19 1420      PT LONG TERM GOAL #1   Title  independent with HEP and how to progress herself    Time  12    Period  Weeks    Status  New    Target Date  10/23/19      PT LONG TERM GOAL #2   Title  urinary  leakage with coughing and sneezing decreased >/= 80% due to increased pelvic floor strength    Time  12    Period  Weeks    Status  New    Target Date  10/23/19      PT LONG TERM GOAL #3   Title  urinary leakage during intercourse decreased >/= 80% due to increased pelvic floor strength    Time  12    Period  Weeks    Status  New    Target Date  10/23/19      PT LONG TERM GOAL #4   Title  anal pain with bowel movement decreased >/= 75% due to improve tissue mobility    Time  12    Period  Weeks    Status  New    Target Date  10/23/19      PT LONG TERM GOAL #5   Title  ----             Plan - 07/31/19 1411    Clinical Impression Statement  Patient is a 38 year old female  with urinary leakage and anal pain. Patient will leak urine with coughing, sneezing and during intercourse. Patient has anal pain with bowel movement when her fissures open up. Patient reports her pain is 8/10. Patient will leak stool when she has anal pain. Anal sphincter strength is 4/5 and vaginal strength is 2/5. Patient has anterior wall weakness vaginally. Patient has tightness along the posterior anal wall. Patient has tightness located in right pelvic floor muscles. Bilateral hip abduction strength is 4/5. Patient has 1 finger width below umbilicus during an abdominal contraction. Patient lumbar right sidebending and left rotation is decreased by 25%. Right ilium is rotated anteriorly. Patient will benefit from skilled therapy to improve strength and coordination of the pelvic floor muscles to reduce leakage and pain.    Examination-Activity Limitations  Continence;Toileting    Stability/Clinical Decision Making  Evolving/Moderate complexity    Clinical Decision Making  Low    Rehab Potential  Excellent    PT Frequency  1x / week    PT Duration  12 weeks    PT Treatment/Interventions  Biofeedback;Electrical Stimulation;Cryotherapy;Moist Heat;Ultrasound;Therapeutic exercise;Therapeutic  activities;Neuromuscular re-education;Patient/family education;Dry needling;Manual techniques    PT Next Visit Plan  soft tissue work to anus, education on massage anus prior and after bowel movement, toileting technique, abdominal massage for diastasis, vaginal soft tissue work, pelvic floor contraction    Consulted and Agree with Plan of Care  Patient       Patient will benefit from skilled therapeutic intervention in order to improve the following deficits and impairments:  Increased fascial restricitons, Decreased endurance, Increased muscle spasms, Decreased activity tolerance, Pain, Impaired flexibility, Decreased strength  Visit  Diagnosis: 1. Cramp and spasm   2. Muscle weakness (generalized)   3. Other lack of coordination   4. Stress incontinence (female) (female)   5. Anal pain        Problem List Patient Active Problem List   Diagnosis Date Noted  . Anal fissure 09/24/2018  . Anal pain 09/24/2018  . Rectal bleeding 09/24/2018  . Frequent headaches 07/31/2018  . GERD (gastroesophageal reflux disease) 07/31/2018  . High blood pressure 07/31/2018  . Migraines 07/31/2018  . Urine incontinence 07/31/2018  . Urinary tract infection 07/31/2018    Eulis Fosterheryl Gray, PT 07/31/19 2:23 PM   [facility] [address], [address] [place], [place], [address] Phone: [phone]   Fax:  [phone]  Name: Margaret Rich MRN: [id number] Date of Birth: 01-Feb-1981

## 2019-08-05 ENCOUNTER — Ambulatory Visit: Payer: BC Managed Care – PPO | Admitting: Physical Therapy

## 2019-08-14 ENCOUNTER — Encounter: Payer: BC Managed Care – PPO | Admitting: Physical Therapy

## 2019-08-15 ENCOUNTER — Other Ambulatory Visit: Payer: Self-pay

## 2019-08-15 ENCOUNTER — Ambulatory Visit: Payer: BC Managed Care – PPO | Admitting: Physical Therapy

## 2019-08-15 ENCOUNTER — Encounter: Payer: Self-pay | Admitting: Physical Therapy

## 2019-08-15 DIAGNOSIS — N393 Stress incontinence (female) (male): Secondary | ICD-10-CM

## 2019-08-15 DIAGNOSIS — R278 Other lack of coordination: Secondary | ICD-10-CM

## 2019-08-15 DIAGNOSIS — K6289 Other specified diseases of anus and rectum: Secondary | ICD-10-CM

## 2019-08-15 DIAGNOSIS — M6281 Muscle weakness (generalized): Secondary | ICD-10-CM

## 2019-08-15 DIAGNOSIS — R252 Cramp and spasm: Secondary | ICD-10-CM

## 2019-08-15 NOTE — Patient Instructions (Addendum)
Toileting Techniques for Bowel Movements (Defecation) Using your belly (abdomen) and pelvic floor muscles to have a bowel movement is usually instinctive.  Sometimes people can have problems with these muscles and have to relearn proper defecation (emptying) techniques.  If you have weakness in your muscles, organs that are falling out, decreased sensation in your pelvis, or ignore your urge to go, you may find yourself straining to have a bowel movement.  You are straining if you are: . holding your breath or taking in a huge gulp of air and holding it  . keeping your lips and jaw tensed and closed tightly . turning red in the face because of excessive pushing or forcing . developing or worsening your  hemorrhoids . getting faint while pushing . not emptying completely and have to defecate many times a day  If you are straining, you are actually making it harder for yourself to have a bowel movement.  Many people find they are pulling up with the pelvic floor muscles and closing off instead of opening the anus. Due to lack pelvic floor relaxation and coordination the abdominal muscles, one has to work harder to push the feces out.  Many people have never been taught how to defecate efficiently and effectively.  Notice what happens to your body when you are having a bowel movement.  While you are sitting on the toilet pay attention to the following areas: . Jaw and mouth position . Angle of your hips   . Whether your feet touch the ground or not . Arm placement  . Spine position . Waist . Belly tension . Anus (opening of the anal canal)  An Evacuation/Defecation Plan   Here are the 4 basic points:  1. Lean forward enough for your elbows to rest on your knees 2. Support your feet on the floor or use a low stool if your feet don't touch the floor  3. Push out your belly as if you have swallowed a beach ball-you should feel a widening of your waist 4. Open and relax your pelvic floor muscles,  rather than tightening around the anus       The following conditions my require modifications to your toileting posture:  . If you have had surgery in the past that limits your back, hip, pelvic, knee or ankle flexibility . Constipation   Your healthcare practitioner may make the following additional suggestions and adjustments:  1) Sit on the toilet  a) Make sure your feet are supported. b) Notice your hip angle and spine position-most people find it effective to lean forward or raise their knees, which can help the muscles around the anus to relax  c) When you lean forward, place your forearms on your thighs for support  2) Relax suggestions a) Breath deeply in through your nose and out slowly through your mouth as if you are smelling the flowers and blowing out the candles. b) To become aware of how to relax your muscles, contracting and releasing muscles can be helpful.  Pull your pelvic floor muscles in tightly by using the image of holding back gas, or closing around the anus (visualize making a circle smaller) and lifting the anus up and in.  Then release the muscles and your anus should drop down and feel open. Repeat 5 times ending with the feeling of relaxation. c) Keep your pelvic floor muscles relaxed; let your belly bulge out. d) The digestive tract starts at the mouth and ends at the anal opening, so   be sure to relax both ends of the tube.  Place your tongue on the roof of your mouth with your teeth separated.  This helps relax your mouth and will help to relax the anus at the same time.  3) Empty (defecation) a) Keep your pelvic floor and sphincter relaxed, then bulge your anal muscles.  Make the anal opening wide.  b) Stick your belly out as if you have swallowed a beach ball. c) Make your belly wall hard using your belly muscles while continuing to breathe. Doing this makes it easier to open your anus. d) Breath out and give a grunt (or try using other sounds such as  ahhhh, shhhhh, ohhhh or grrrrrrr).  4) Finish a) As you finish your bowel movement, pull the pelvic floor muscles up and in.  This will leave your anus in the proper place rather than remaining pushed out and down. If you leave your anus pushed out and down, it will start to feel as though that is normal and give you incorrect signals about needing to have a bowel movement. Prior to bowel movement massage anus   About Abdominal Massage  Abdominal massage, also called external colon massage, is a self-treatment circular massage technique that can reduce and eliminate gas and ease constipation. The colon naturally contracts in waves in a clockwise direction starting from inside the right hip, moving up toward the ribs, across the belly, and down inside the left hip.  When you perform circular abdominal massage, you help stimulate your colon's normal wave pattern of movement called peristalsis.  It is most beneficial when done after eating.  Positioning You can practice abdominal massage with oil while lying down, or in the shower with soap.  Some people find that it is just as effective to do the massage through clothing while sitting or standing.  How to Massage Start by placing your finger tips or knuckles on your right side, just inside your hip bone.  . Make small circular movements while you move upward toward your rib cage.   . Once you reach the bottom right side of your rib cage, take your circular movements across to the left side of the bottom of your rib cage.  . Next, move downward until you reach the inside of your left hip bone.  This is the path your feces travel in your colon. . Continue to perform your abdominal massage in this pattern for 10 minutes each day.     You can apply as much pressure as is comfortable in your massage.  Start gently and build pressure as you continue to practice.  Notice any areas of pain as you massage; areas of slight pain may be relieved as you  massage, but if you have areas of significant or intense pain, consult with your healthcare provider.  Other Considerations . General physical activity including bending and stretching can have a beneficial massage-like effect on the colon.  Deep breathing can also stimulate the colon because breathing deeply activates the same nervous system that supplies the colon.   . Abdominal massage should always be used in combination with a bowel-conscious diet that is high in the proper type of fiber for you, fluids (primarily water), and a regular exercise program.     Adduction: Hip - Knees Together With Pelvic Floor (Sitting)    Sit with towel roll between knees. Squeeze pelvic floor while pushing knees together. Hold for __5_ seconds. Rest for _5__ seconds. Repeat _10__ times. Do _3__  times a day.  Copyright  VHI. All rights reserved.  [place] [address], [place], [place] [address] Phone # [phone] Fax [phone]

## 2019-08-15 NOTE — Therapy (Signed)
[facility] [address], [address] [place], [place], [address] Phone: [phone]   Fax:  [phone]  Physical Therapy Treatment  Patient Details  Name: Margaret Rich MRN: [id number] Date of Birth: Oct 07, 1981 Referring Provider (PT): Karena AddisonMeredith C. Sigmon, CNM   Encounter Date: 08/15/2019  PT End of Session - 08/15/19 1156    Visit Number  2    Date for PT Re-Evaluation  10/23/19    Authorization Type  BCBS    PT Start Time  1100    PT Stop Time  1140    PT Time Calculation (min)  40 min    Activity Tolerance  Patient tolerated treatment well;No increased pain    Behavior During Therapy  [facility] for tasks assessed/performed       History reviewed. No pertinent past medical history.  History reviewed. No pertinent surgical history.  There were no vitals filed for this visit.  Subjective Assessment - 08/15/19 1110    Subjective  I felt fine after the evaluation.    Patient Stated Goals  reduce the urinary continence, improve pelvic floor tone, reduce anal pain    Currently in Pain?  No/denies                       [facility] Treatment/Exercise - 08/15/19 0001      Self-Care   Self-Care  Other Self-Care Comments    Other Self-Care Comments   ways to take care of anal fissuer with massaging around the anus prior to the bowel movement, coconut oil, diaphragmatic breathing      Therapeutic Activites    Therapeutic Activities  Other Therapeutic Activities    Other Therapeutic Activities  instruction on correct toileting technique with diaphragmatic breathing, relaxation of the pelvic floor      Lumbar Exercises: Seated   Other Seated Lumbar Exercises  abdominal bracing with pelvic floor contraction with ball squeeze      Lumbar Exercises: Supine   Ab Set  5 reps;5 seconds      Manual Therapy   Manual Therapy  Soft tissue mobilization;Myofascial release    Soft tissue mobilization  circular massage for  peristalic motion of the intestines; lumbar paraspinals and lateral trunk fascia; quadruped pulling fascia forward to the abdomen, soft tissue work in lower abdomen in quadruped to relesae around the lower intestines, bilateral diaphragm    Myofascial Release  suprapubically to release around the bladder             PT Education - 08/15/19 1148    Education Details  pelvic floor contraction in sitting with ball squeeze; toileting technique, abdominal massage    Person(s) Educated  Patient    Methods  Explanation;Demonstration;Verbal cues;Handout    Comprehension  Returned demonstration;Verbalized understanding       PT Short Term Goals - 07/31/19 1418      PT SHORT TERM GOAL #1   Title  independent with initial HEP    Time  4    Period  Weeks    Status  New    Target Date  08/28/19      PT SHORT TERM GOAL #2   Title  urinary leakage with coughing and sneezing decreased >/= 25%    Time  4    Period  Weeks    Status  New    Target Date  08/28/19      PT SHORT TERM GOAL #3   Title  urinary leakage  with intercourse decreased >/= 25%    Time  4    Period  Weeks    Status  New    Target Date  08/28/19      PT SHORT TERM GOAL #4   Title  understand how  valsalva manuever can strain pelvic floor and increase prolapse and increase urinary leakage with intercourse    Time  4    Period  Weeks    Status  New    Target Date  08/28/19      PT SHORT TERM GOAL #5   Title  understand ways to manage uterine prolapse to prevent progression    Time  4    Period  Weeks    Status  New    Target Date  08/28/19        PT Long Term Goals - 07/31/19 1420      PT LONG TERM GOAL #1   Title  independent with HEP and how to progress herself    Time  12    Period  Weeks    Status  New    Target Date  10/23/19      PT LONG TERM GOAL #2   Title  urinary leakage with coughing and sneezing decreased >/= 80% due to increased pelvic floor strength    Time  12    Period  Weeks     Status  New    Target Date  10/23/19      PT LONG TERM GOAL #3   Title  urinary leakage during intercourse decreased >/= 80% due to increased pelvic floor strength    Time  12    Period  Weeks    Status  New    Target Date  10/23/19      PT LONG TERM GOAL #4   Title  anal pain with bowel movement decreased >/= 75% due to improve tissue mobility    Time  12    Period  Weeks    Status  New    Target Date  10/23/19      PT LONG TERM GOAL #5   Title  ----            Plan - 08/15/19 1111    Clinical Impression Statement  Patient is able to demonstrated correct toileting techniques with diaphragmatic breathing. Patient has been using the coconut oil on the anus and it is helping. Patient able to contract her abdominals correctly with improved lower abdominals. Patient will benefit from skilled therapy to improve strength and coordination of the pelvic floor muscles to reduce leakage and pain.    Examination-Activity Limitations  Continence;Toileting    Stability/Clinical Decision Making  Evolving/Moderate complexity    Rehab Potential  Excellent    PT Frequency  1x / week    PT Duration  12 weeks    PT Treatment/Interventions  Biofeedback;Electrical Stimulation;Cryotherapy;Moist Heat;Ultrasound;Therapeutic exercise;Therapeutic activities;Neuromuscular re-education;Patient/family education;Dry needling;Manual techniques    PT Next Visit Plan  soft tissue work to anus, vaginal soft tissue work, pelvic floor contraction, supine and sitting, lower abdominal contraction    Recommended Other Services  MD signed intial eval    Consulted and Agree with Plan of Care  Patient       Patient will benefit from skilled therapeutic intervention in order to improve the following deficits and impairments:  Increased fascial restricitons, Decreased endurance, Increased muscle spasms, Decreased activity tolerance, Pain, Impaired flexibility, Decreased strength  Visit Diagnosis: Cramp and  spasm  Muscle weakness (generalized)  Other lack of coordination  Stress incontinence (female) (female)  Anal pain     Problem List Patient Active Problem List   Diagnosis Date Noted  . Anal fissure 09/24/2018  . Anal pain 09/24/2018  . Rectal bleeding 09/24/2018  . Frequent headaches 07/31/2018  . GERD (gastroesophageal reflux disease) 07/31/2018  . High blood pressure 07/31/2018  . Migraines 07/31/2018  . Urine incontinence 07/31/2018  . Urinary tract infection 07/31/2018    [name], PT 08/15/19 12:00 PM   [facility] [address] [place], [place], [address] Phone: [phone]   Fax:  [phone]  Name: Rochel Moroyoqui MRN: [id number] Date of Birth: 1981-06-09

## 2019-08-19 ENCOUNTER — Other Ambulatory Visit: Payer: Self-pay

## 2019-08-19 ENCOUNTER — Ambulatory Visit: Payer: BC Managed Care – PPO | Admitting: Physical Therapy

## 2019-08-19 DIAGNOSIS — Z20822 Contact with and (suspected) exposure to covid-19: Secondary | ICD-10-CM

## 2019-08-20 ENCOUNTER — Other Ambulatory Visit: Payer: Self-pay

## 2019-08-20 DIAGNOSIS — Z20822 Contact with and (suspected) exposure to covid-19: Secondary | ICD-10-CM

## 2019-08-21 ENCOUNTER — Encounter: Payer: BC Managed Care – PPO | Admitting: Physical Therapy

## 2019-08-21 LAB — NOVEL CORONAVIRUS, NAA: SARS-CoV-2, NAA: NOT DETECTED

## 2019-08-26 ENCOUNTER — Ambulatory Visit: Payer: BC Managed Care – PPO | Attending: Obstetrics and Gynecology | Admitting: Physical Therapy

## 2019-08-26 ENCOUNTER — Other Ambulatory Visit: Payer: Self-pay

## 2019-08-26 ENCOUNTER — Encounter: Payer: Self-pay | Admitting: Physical Therapy

## 2019-08-26 DIAGNOSIS — N393 Stress incontinence (female) (male): Secondary | ICD-10-CM | POA: Diagnosis present

## 2019-08-26 DIAGNOSIS — M6281 Muscle weakness (generalized): Secondary | ICD-10-CM | POA: Insufficient documentation

## 2019-08-26 DIAGNOSIS — R278 Other lack of coordination: Secondary | ICD-10-CM | POA: Diagnosis present

## 2019-08-26 DIAGNOSIS — R252 Cramp and spasm: Secondary | ICD-10-CM | POA: Diagnosis not present

## 2019-08-26 DIAGNOSIS — K6289 Other specified diseases of anus and rectum: Secondary | ICD-10-CM | POA: Insufficient documentation

## 2019-08-26 NOTE — Patient Instructions (Signed)
Access Code: [email or web address]  URL: [email or web address]  Date: 08/26/2019  Prepared by: Earlie Counts   Exercises Bent Knee Fallouts - 10 reps - 2 sets - 1x daily - 7x weekly Supine March - 10 reps - 2 sets - 1x daily - 7x weekly Supine Bridge with Mini Swiss Ball Between Knees - 10 reps - 2 sets - 1x daily - 7x weekly Lunge with Pelvic Floor Contraction - 10 reps - 1 sets - 1x daily - 7x weekly Mini Squat with Pelvic Floor Contraction - 10 reps - 1 sets - 1x daily - 7x weekly Seated Pelvic Floor Contraction - 10 reps - 1 sets - 10 sec hold - 3x daily - 7x weekly [facility] [address], [place], [place] [address] Phone # [phone] Fax [phone]

## 2019-08-26 NOTE — Therapy (Signed)
[facility] [address], [place], [place], [address] Phone: [phone]   Fax:  [phone]  Physical Therapy Treatment  Patient Details  Name: Margaret Rich MRN: [id number] Date of Birth: [birth year] Referring Provider (PT): Darliss Cheney, CNM   Encounter Date: 08/26/2019  PT End of Session - 08/26/19 1148    Visit Number  3    Date for PT Re-Evaluation  10/23/19    Authorization Type  BCBS    PT Start Time  1146    PT Stop Time  1225    PT Time Calculation (min)  39 min    Activity Tolerance  Patient tolerated treatment well;No increased pain    Behavior During Therapy  [facility] for tasks assessed/performed       History reviewed. No pertinent past medical history.  History reviewed. No pertinent surgical history.  There were no vitals filed for this visit.  Subjective Assessment - 08/26/19 1151    Subjective  I had a GI bug that went through out the house. I feel better from the GI bug. I did not have a fissure with diarrhea. The coconut oil has helped. Patient has not noticed her prolpase this morning.    Patient Stated Goals  reduce the urinary continence, improve pelvic floor tone, reduce anal pain    Currently in Pain?  No/denies         [facility] PT Assessment - 08/26/19 0001      Palpation   SI assessment   no pelvic rotation                Pelvic Floor Special Questions - 08/26/19 0001    Diastasis Recti  none        [facility] Adult PT Treatment/Exercise - 08/26/19 0001      Exercises   Exercises  Other Exercises    Other Exercises   contract pelvic floor for 10 seconds, rest 10 seconds 85U then 5 quick flicks      Lumbar Exercises: Supine   Bridge with Ball Squeeze  20 reps;1 second   with pelvic floor contraction   Bridge with clamshell  10 reps;1 second   each leg   Bridge with [employer] Limitations  VC to not move pelvis    Bridge with March  15 reps;1 second   tactile cues  to contract the lower abdomen     Knee/Hip Exercises: Standing   Forward Lunges  Right;Left;1 set;10 reps   with pelvic floor contraction   Functional Squat  5 reps   10# box, 10# plus 6#l 10# plus 12#            PT Education - 08/26/19 1228    Education Details  Access Code: [email or web address]    Person(s) Educated  Patient    Methods  Explanation;Demonstration;Handout    Comprehension  Returned demonstration;Verbalized understanding       PT Short Term Goals - 08/26/19 1153      PT SHORT TERM GOAL #1   Title  independent with initial HEP    Time  4    Period  Weeks    Status  Achieved    Target Date  08/28/19      PT SHORT TERM GOAL #2   Title  urinary leakage with coughing and sneezing decreased >/= 25%    Time  4    Period  Weeks    Status  Achieved    Target Date  08/28/19  PT SHORT TERM GOAL #3   Title  urinary leakage with intercourse decreased >/= 25%    Time  4    Period  Weeks    Status  Achieved    Target Date  08/28/19      PT SHORT TERM GOAL #4   Title  understand how  valsalva manuever can strain pelvic floor and increase prolapse and increase urinary leakage with intercourse    Time  4    Period  Weeks    Status  Achieved    Target Date  08/28/19      PT SHORT TERM GOAL #5   Title  understand ways to manage uterine prolapse to prevent progression    Time  4    Period  Weeks    Status  Achieved    Target Date  08/28/19        PT Long Term Goals - 07/31/19 1420      PT LONG TERM GOAL #1   Title  independent with HEP and how to progress herself    Time  12    Period  Weeks    Status  New    Target Date  10/23/19      PT LONG TERM GOAL #2   Title  urinary leakage with coughing and sneezing decreased >/= 80% due to increased pelvic floor strength    Time  12    Period  Weeks    Status  New    Target Date  10/23/19      PT LONG TERM GOAL #3   Title  urinary leakage during intercourse decreased >/= 80% due to increased pelvic floor  strength    Time  12    Period  Weeks    Status  New    Target Date  10/23/19      PT LONG TERM GOAL #4   Title  anal pain with bowel movement decreased >/= 75% due to improve tissue mobility    Time  12    Period  Weeks    Status  New    Target Date  10/23/19      PT LONG TERM GOAL #5   Title  ----            Plan - 08/26/19 1149    Clinical Impression Statement  Patient has not noticed any urinary leakage since her last visit. Patient has not noticed a falling out feeling since last visit. Patient had a stomach virus last week and did not get a fissure due to using the coconut oil. Patient has no diastasis recti and is able to work on abdominal strength. Patient will benefit from skilled therapy to improve strength and coordination of the pelvic floor muscles to reduce leakage and pain.    Examination-Activity Limitations  Continence;Toileting    Stability/Clinical Decision Making  Evolving/Moderate complexity    Rehab Potential  Excellent    PT Frequency  1x / week    PT Duration  12 weeks    PT Treatment/Interventions  Biofeedback;Electrical Stimulation;Cryotherapy;Moist Heat;Ultrasound;Therapeutic exercise;Therapeutic activities;Neuromuscular re-education;Patient/family education;Dry needling;Manual techniques    PT Next Visit Plan  vaginal soft tissue work, pelvic floor contraction,  lower abdominal contraction; assess prloapse    PT Home Exercise Plan  Access Code: [email or web address]    Consulted and Agree with Plan of Care  Patient       Patient will benefit from skilled therapeutic intervention in order to improve the following deficits and impairments:  Increased fascial restricitons, Decreased endurance, Increased muscle spasms, Decreased activity tolerance, Pain, Impaired flexibility, Decreased strength  Visit Diagnosis: Cramp and spasm  Muscle weakness (generalized)  Other lack of coordination  Stress incontinence (female) (female)  Anal pain     Problem  List Patient Active Problem List   Diagnosis Date Noted  . Anal fissure 09/24/2018  . Anal pain 09/24/2018  . Rectal bleeding 09/24/2018  . Frequent headaches 07/31/2018  . GERD (gastroesophageal reflux disease) 07/31/2018  . High blood pressure 07/31/2018  . Migraines 07/31/2018  . Urine incontinence 07/31/2018  . Urinary tract infection 07/31/2018    [name], PT 08/26/19 12:33 PM   [facility] [address], [address] [place], [place], [address] Phone: [phone]   Fax:  [phone]  Name: Margaret Rich MRN: [id number] Date of Birth: 1981-10-24

## 2019-09-02 ENCOUNTER — Encounter: Payer: BC Managed Care – PPO | Admitting: Physical Therapy

## 2019-09-09 ENCOUNTER — Encounter: Payer: Self-pay | Admitting: Physical Therapy

## 2019-09-09 ENCOUNTER — Other Ambulatory Visit: Payer: Self-pay

## 2019-09-09 ENCOUNTER — Ambulatory Visit: Payer: BC Managed Care – PPO | Admitting: Physical Therapy

## 2019-09-09 DIAGNOSIS — M6281 Muscle weakness (generalized): Secondary | ICD-10-CM

## 2019-09-09 DIAGNOSIS — R252 Cramp and spasm: Secondary | ICD-10-CM | POA: Diagnosis not present

## 2019-09-09 DIAGNOSIS — N393 Stress incontinence (female) (male): Secondary | ICD-10-CM

## 2019-09-09 DIAGNOSIS — R278 Other lack of coordination: Secondary | ICD-10-CM

## 2019-09-09 DIAGNOSIS — K6289 Other specified diseases of anus and rectum: Secondary | ICD-10-CM

## 2019-09-09 NOTE — Patient Instructions (Signed)
Access Code: [email or web address]  URL: [email or web address]  Date: 09/09/2019  Prepared by: Earlie Counts   Exercises Bent Knee Fallouts - 10 reps - 2 sets - 1x daily - 7x weekly Supine March - 10 reps - 2 sets - 1x daily - 7x weekly Supine Bridge with Mini Swiss Ball Between Knees - 10 reps - 2 sets - 1x daily - 7x weekly Lunge with Pelvic Floor Contraction - 10 reps - 1 sets - 1x daily - 7x weekly Mini Squat with Pelvic Floor Contraction - 10 reps - 1 sets - 1x daily - 7x weekly Seated Pelvic Floor Contraction - 10 reps - 1 sets - 10 sec hold - 3x daily - 7x weekly [facility] [address], [place], [place] [address] Phone # [phone] Fax [phone]

## 2019-09-09 NOTE — Therapy (Signed)
[facility] [address] [place], [place], [address] Phone: [phone]   Fax:  [phone]  Physical Therapy Treatment  Patient Details  Name: Margaret Rich MRN: [id number] Date of Birth: 02-04-1981 Referring Provider (PT): Karena Addison, CNM   Encounter Date: 09/09/2019  PT End of Session - 09/09/19 1154    Visit Number  4    Date for PT Re-Evaluation  10/23/19    Authorization Type  BCBS    PT Start Time  1154    PT Stop Time  1232    PT Time Calculation (min)  38 min    Activity Tolerance  Patient tolerated treatment well;No increased pain    Behavior During Therapy  [facility] for tasks assessed/performed       History reviewed. No pertinent past medical history.  History reviewed. No pertinent surgical history.  There were no vitals filed for this visit.  Subjective Assessment - 09/09/19 1154    Subjective  I am not doing the exercises as I should. The prolapse is 10-15% better. I have not had urinary leakage for several weeks. I had one small time with soreness in the rectum.    Patient Stated Goals  reduce the urinary continence, improve pelvic floor tone, reduce anal pain    Currently in Pain?  Yes    Pain Score  3     Pain Location  Rectum    Pain Orientation  Mid    Pain Descriptors / Indicators  Sharp;Radiating    Pain Type  Chronic pain    Pain Onset  More than a month ago    Pain Frequency  Intermittent    Aggravating Factors   bowel movement, fecal leakage, when flare-up    Pain Relieving Factors  clean the area, epson salt soak bath, rectal care    Multiple Pain Sites  No                    Pelvic Floor Special Questions - 09/09/19 0001    Urinary Leakage  No    Pad use  no    External Perineal Exam  whitehead looking area posterior ot anus    External Palpation  tightness along the external anal sphincter     Pelvic Floor Internal Exam  Patient confirms identification and  approves PT to assess the pelvic floor and treatment    Exam Type  Rectal        [facility] Adult PT Treatment/Exercise - 09/09/19 0001      Exercises   Exercises  Other Exercises    Other Exercises   discussed with patient on how to incorporate her exercise during the day       Lumbar Exercises: Supine   Clam  10 reps;1 second   each leg with yellow band around knees   Clam Limitations  Tactile cues to not rock the pelvis    Bridge with [employer]  20 reps;1 second   with pelvic floor contraction   Bridge with [employer] Limitations  with pelvic floor contraction    Bridge with March  15 reps;1 second   tactile cues to contract the lower abdomen   Bridge with [employer] Limitations  tactile cues to keep the lower abdominal contracted      Manual Therapy   Manual Therapy  Soft tissue mobilization    Soft tissue mobilization  circular massage around the anus to relax the external anal sphincter  PT Education - 09/09/19 1233    Education Details  Access Code: [email or web address]    Person(s) Educated  Patient    Methods  Explanation;Demonstration;Handout    Comprehension  Verbalized understanding;Returned demonstration       PT Short Term Goals - 08/26/19 1153      PT SHORT TERM GOAL #1   Title  independent with initial HEP    Time  4    Period  Weeks    Status  Achieved    Target Date  08/28/19      PT SHORT TERM GOAL #2   Title  urinary leakage with coughing and sneezing decreased >/= 25%    Time  4    Period  Weeks    Status  Achieved    Target Date  08/28/19      PT SHORT TERM GOAL #3   Title  urinary leakage with intercourse decreased >/= 25%    Time  4    Period  Weeks    Status  Achieved    Target Date  08/28/19      PT SHORT TERM GOAL #4   Title  understand how  valsalva manuever can strain pelvic floor and increase prolapse and increase urinary leakage with intercourse    Time  4    Period  Weeks    Status  Achieved    Target Date   08/28/19      PT SHORT TERM GOAL #5   Title  understand ways to manage uterine prolapse to prevent progression    Time  4    Period  Weeks    Status  Achieved    Target Date  08/28/19        PT Long Term Goals - 09/09/19 1200      PT LONG TERM GOAL #1   Title  independent with HEP and how to progress herself    Time  12    Period  Weeks    Status  On-going      PT LONG TERM GOAL #2   Title  urinary leakage with coughing and sneezing decreased >/= 80% due to increased pelvic floor strength    Time  12    Period  Weeks    Status  Achieved      PT LONG TERM GOAL #3   Title  urinary leakage during intercourse decreased >/= 80% due to increased pelvic floor strength    Time  12    Period  Weeks    Status  On-going      PT LONG TERM GOAL #4   Title  anal pain with bowel movement decreased >/= 75% due to improve tissue mobility    Baseline  pain is 50%    Time  12    Period  Weeks    Status  On-going            Plan - 09/09/19 1159    Clinical Impression Statement  Patient is not having urinary leakage. Patient feels her prolpase is better by 15%. She feels the prolapse more with squatting. Patient is not leaking urine with intercourse. Patient rectal pain is 50% better. Patient has a good rectal contraction. Patient will benefit from skilled therapy to improve strength and coordination of the pelvic floor to reduce prolapse.    Examination-Activity Limitations  Continence;Toileting    Stability/Clinical Decision Making  Evolving/Moderate complexity    Rehab Potential  Excellent    PT Duration  12  weeks    PT Treatment/Interventions  Biofeedback;Electrical Stimulation;Cryotherapy;Moist Heat;Ultrasound;Therapeutic exercise;Therapeutic activities;Neuromuscular re-education;Patient/family education;Dry needling;Manual techniques    PT Next Visit Plan  work on pelvic floor contraction in squatting, assess rectal pain    PT Home Exercise Plan  Access Code: [email or web address]     Consulted and Agree with Plan of Care  Patient       Patient will benefit from skilled therapeutic intervention in order to improve the following deficits and impairments:  Increased fascial restricitons, Decreased endurance, Increased muscle spasms, Decreased activity tolerance, Pain, Impaired flexibility, Decreased strength  Visit Diagnosis: Cramp and spasm  Muscle weakness (generalized)  Other lack of coordination  Stress incontinence (female) (female)  Anal pain     Problem List Patient Active Problem List   Diagnosis Date Noted  . Anal fissure 09/24/2018  . Anal pain 09/24/2018  . Rectal bleeding 09/24/2018  . Frequent headaches 07/31/2018  . GERD (gastroesophageal reflux disease) 07/31/2018  . High blood pressure 07/31/2018  . Migraines 07/31/2018  . Urine incontinence 07/31/2018  . Urinary tract infection 07/31/2018    [name], PT 09/09/19 12:46 PM   [facility] [address] [place], [place], [address] Phone: [phone]   Fax:  [phone]  Name: Margaret Rich MRN: [id number] Date of Birth: 1981-04-17

## 2019-09-16 ENCOUNTER — Other Ambulatory Visit: Payer: Self-pay

## 2019-09-16 ENCOUNTER — Encounter: Payer: Self-pay | Admitting: Physical Therapy

## 2019-09-16 ENCOUNTER — Ambulatory Visit: Payer: BC Managed Care – PPO | Admitting: Physical Therapy

## 2019-09-16 DIAGNOSIS — R252 Cramp and spasm: Secondary | ICD-10-CM

## 2019-09-16 DIAGNOSIS — N393 Stress incontinence (female) (male): Secondary | ICD-10-CM

## 2019-09-16 DIAGNOSIS — R278 Other lack of coordination: Secondary | ICD-10-CM

## 2019-09-16 DIAGNOSIS — K6289 Other specified diseases of anus and rectum: Secondary | ICD-10-CM

## 2019-09-16 DIAGNOSIS — M6281 Muscle weakness (generalized): Secondary | ICD-10-CM

## 2019-09-16 NOTE — Therapy (Signed)
[facility] [address] [place], [place], [address] Phone: [phone]   Fax:  [phone]  Physical Therapy Treatment  Patient Details  Name: Margaret Rich MRN: [id number] Date of Birth: October 28, 1981 Referring Provider (PT): Karena Addison, CNM   Encounter Date: 09/16/2019  PT End of Session - 09/16/19 1241    Visit Number  5    Date for PT Re-Evaluation  10/23/19    Authorization Type  BCBS    PT Start Time  1150    PT Stop Time  1230    PT Time Calculation (min)  40 min    Activity Tolerance  Patient tolerated treatment well;No increased pain    Behavior During Therapy  [facility] for tasks assessed/performed       History reviewed. No pertinent past medical history.  History reviewed. No pertinent surgical history.  There were no vitals filed for this visit.  Subjective Assessment - 09/16/19 1159    Subjective  I was sitting cross legged when I was rocking my daughter sneezed and leaked. The soreness in the rectum is better.    Patient Stated Goals  reduce the urinary continence, improve pelvic floor tone, reduce anal pain    Currently in Pain?  No/denies                    Pelvic Floor Special Questions - 09/16/19 0001    Pelvic Floor Internal Exam  Patient confirms identification and approves PT to assess the pelvic floor and treatment    Exam Type  Vaginal    Palpation  tightness in the levator ani midline    Strength  good squeeze, good lift, able to hold agaisnt strong resistance        [facility] Adult PT Treatment/Exercise - 09/16/19 0001      Neuro Re-ed    Neuro Re-ed Details   pelvic floor contraction in supine, breath in , lift head and contract to prepare for a cough, supine pelvic floor contraction in supine with laugh      Lumbar Exercises: Aerobic   Nustep  level 1 for 8 minutes while assessing the patient      Manual Therapy   Manual Therapy  Soft tissue  mobilization;Internal Pelvic Floor    Soft tissue mobilization  circular massage around the anus to relax the external anal sphincter    Internal Pelvic Floor  soft tissue work to bilateral levator ani, release on the sides of the bladder, urethra and urethra spincter             PT Education - 09/16/19 1240    Education Details  pelvic floor contraction in supine with cough    Person(s) Educated  Patient    Methods  Explanation;Demonstration;Verbal cues;Handout    Comprehension  Returned demonstration;Verbalized understanding       PT Short Term Goals - 08/26/19 1153      PT SHORT TERM GOAL #1   Title  independent with initial HEP    Time  4    Period  Weeks    Status  Achieved    Target Date  08/28/19      PT SHORT TERM GOAL #2   Title  urinary leakage with coughing and sneezing decreased >/= 25%    Time  4    Period  Weeks    Status  Achieved    Target Date  08/28/19      PT SHORT TERM GOAL #3  Title  urinary leakage with intercourse decreased >/= 25%    Time  4    Period  Weeks    Status  Achieved    Target Date  08/28/19      PT SHORT TERM GOAL #4   Title  understand how  valsalva manuever can strain pelvic floor and increase prolapse and increase urinary leakage with intercourse    Time  4    Period  Weeks    Status  Achieved    Target Date  08/28/19      PT SHORT TERM GOAL #5   Title  understand ways to manage uterine prolapse to prevent progression    Time  4    Period  Weeks    Status  Achieved    Target Date  08/28/19        PT Long Term Goals - 09/16/19 1201      PT LONG TERM GOAL #1   Title  independent with HEP and how to progress herself    Time  12    Period  Weeks    Status  On-going      PT LONG TERM GOAL #2   Title  urinary leakage with coughing and sneezing decreased >/= 80% due to increased pelvic floor strength    Time  12    Period  Weeks    Status  Achieved      PT LONG TERM GOAL #3   Title  urinary leakage during  intercourse decreased >/= 80% due to increased pelvic floor strength    Baseline  not happen yet    Time  12    Period  Weeks    Status  On-going      PT LONG TERM GOAL #4   Title  anal pain with bowel movement decreased >/= 75% due to improve tissue mobility    Time  12    Period  Weeks    Status  Achieved            Plan - 09/16/19 1241    Clinical Impression Statement  Patient has leaked urine when she coughs with her legs crossed and rocking her daughter. Patient has tigthness around the rectum externally and internally going through the vagina. Pelvic floor strength is 4/5. Patient has difficulty with holding a pelvic floor contraction in supine with head off pillow and cough. Patient will benefit from skilled therapy to improve strength and coordination of the pelvic floor to reduce prolapse.    Examination-Activity Limitations  Continence;Toileting    Stability/Clinical Decision Making  Evolving/Moderate complexity    Rehab Potential  Excellent    PT Frequency  1x / week    PT Duration  12 weeks    PT Treatment/Interventions  Biofeedback;Electrical Stimulation;Cryotherapy;Moist Heat;Ultrasound;Therapeutic exercise;Therapeutic activities;Neuromuscular re-education;Patient/family education;Dry needling;Manual techniques    PT Next Visit Plan  work on pelvic floor contraction in sitting with a cough, internal soft tissue work, check mobility of the diaphragm    PT Home Exercise Plan  Access Code: [email or web address]    Consulted and Agree with Plan of Care  Patient       Patient will benefit from skilled therapeutic intervention in order to improve the following deficits and impairments:  Increased fascial restricitons, Decreased endurance, Increased muscle spasms, Decreased activity tolerance, Pain, Impaired flexibility, Decreased strength  Visit Diagnosis: Cramp and spasm  Muscle weakness (generalized)  Other lack of coordination  Stress incontinence (female) (female)  Anal  pain  Problem List Patient Active Problem List   Diagnosis Date Noted  . Anal fissure 09/24/2018  . Anal pain 09/24/2018  . Rectal bleeding 09/24/2018  . Frequent headaches 07/31/2018  . GERD (gastroesophageal reflux disease) 07/31/2018  . High blood pressure 07/31/2018  . Migraines 07/31/2018  . Urine incontinence 07/31/2018  . Urinary tract infection 07/31/2018    Eulis Fosterheryl Chenel Wernli, PT 09/16/19 12:47 PM   [facility] [address] [place], [place], [address] Phone: [phone]   Fax:  [phone]  Name: Margaret Rich MRN: [id number] Date of Birth: [birth year]

## 2019-09-16 NOTE — Patient Instructions (Addendum)
Cough: Phase 4 (Supine)    Lie flat. Squeeze pelvic floor and hold. Inhale. Lift head and shoulders. Cough repeatedly. Relax. Repeat _5__ times. Do __1_ times a day.   Copyright  VHI. All rights reserved.  [facility] [address], [place], [place] [address] Phone # [phone] Fax [phone]

## 2019-09-23 ENCOUNTER — Ambulatory Visit: Payer: BC Managed Care – PPO | Attending: Obstetrics and Gynecology | Admitting: Physical Therapy

## 2019-09-23 ENCOUNTER — Encounter: Payer: Self-pay | Admitting: Physical Therapy

## 2019-09-23 ENCOUNTER — Other Ambulatory Visit: Payer: Self-pay

## 2019-09-23 DIAGNOSIS — K6289 Other specified diseases of anus and rectum: Secondary | ICD-10-CM | POA: Insufficient documentation

## 2019-09-23 DIAGNOSIS — R252 Cramp and spasm: Secondary | ICD-10-CM | POA: Diagnosis present

## 2019-09-23 DIAGNOSIS — R278 Other lack of coordination: Secondary | ICD-10-CM | POA: Insufficient documentation

## 2019-09-23 DIAGNOSIS — N393 Stress incontinence (female) (male): Secondary | ICD-10-CM | POA: Diagnosis present

## 2019-09-23 DIAGNOSIS — M6281 Muscle weakness (generalized): Secondary | ICD-10-CM | POA: Diagnosis present

## 2019-09-23 NOTE — Patient Instructions (Signed)
Access Code: [email or web address]  URL: [email or web address]  Date: 09/23/2019  Prepared by: Earlie Counts   Exercises Bent Knee Fallouts - 10 reps - 2 sets - 1x daily - 7x weekly Supine March - 10 reps - 2 sets - 1x daily - 7x weekly Supine Bridge with Mini Swiss Ball Between Knees - 10 reps - 2 sets - 1x daily - 7x weekly Lunge with Pelvic Floor Contraction - 10 reps - 1 sets - 1x daily - 7x weekly Mini Squat with Pelvic Floor Contraction - 10 reps - 1 sets - 1x daily - 7x weekly Seated Pelvic Floor Contraction - 10 reps - 1 sets - 15 sec, 50% hold - 3x daily - 7x weekly Standing Pelvic Floor Contraction - 5 reps - 1 sets - 10 sec at 50% hold - 1x daily - 7x weekly Seated Cough with Pelvic Floor Contraction and Hand to Mouth - 5 reps - 1 sets - 1x daily - 7x weekly [facility] [address], [place], [place] [address] Phone # [phone] Fax [phone]

## 2019-09-23 NOTE — Therapy (Signed)
[facility] [address], [address] [place], [place], [address] Phone: [phone]   Fax:  [phone]  Physical Therapy Treatment  Patient Details  Name: Margaret Rich MRN: [id number] Date of Birth: 12-Aug-1981 Referring Provider (PT): Karena AddisonMeredith C. Sigmon, CNM   Encounter Date: 09/23/2019  PT End of Session - 09/23/19 1150    Visit Number  6    Date for PT Re-Evaluation  10/23/19    Authorization Type  BCBS    PT Start Time  1150    PT Stop Time  1228    PT Time Calculation (min)  38 min    Activity Tolerance  Patient tolerated treatment well;No increased pain    Behavior During Therapy  [facility] for tasks assessed/performed       History reviewed. No pertinent past medical history.  History reviewed. No pertinent surgical history.  There were no vitals filed for this visit.  Subjective Assessment - 09/23/19 1151    Subjective  The rectum is doing well. Patient reports she is able to pee easier. She is able to urinate less frequently. The urinary incontinence is worse and have to consciously think about leaking.    Patient Stated Goals  reduce the urinary continence, improve pelvic floor tone, reduce anal pain    Currently in Pain?  No/denies                    Pelvic Floor Special Questions - 09/23/19 0001    Biofeedback  sitting resting tone 2 uv, quick flicks to 18 uv and equal, 15 sec at 12 uv and 10 sec, full contraction 5 sec in sitting will fatique, contract to bring the turtle up closer to work on endurance.     Biofeedback sensor type  Surface   vaginal               PT Education - 09/23/19 1221    Education Details  Access Code: [email or web address]    Person(s) Educated  Patient       PT Short Term Goals - 08/26/19 1153      PT SHORT TERM GOAL #1   Title  independent with initial HEP    Time  4    Period  Weeks    Status  Achieved    Target Date  08/28/19      PT SHORT TERM GOAL #2    Title  urinary leakage with coughing and sneezing decreased >/= 25%    Time  4    Period  Weeks    Status  Achieved    Target Date  08/28/19      PT SHORT TERM GOAL #3   Title  urinary leakage with intercourse decreased >/= 25%    Time  4    Period  Weeks    Status  Achieved    Target Date  08/28/19      PT SHORT TERM GOAL #4   Title  understand how  valsalva manuever can strain pelvic floor and increase prolapse and increase urinary leakage with intercourse    Time  4    Period  Weeks    Status  Achieved    Target Date  08/28/19      PT SHORT TERM GOAL #5   Title  understand ways to manage uterine prolapse to prevent progression    Time  4    Period  Weeks    Status  Achieved    Target Date  08/28/19        PT Long Term Goals - 09/16/19 1201      PT LONG TERM GOAL #1   Title  independent with HEP and how to progress herself    Time  12    Period  Weeks    Status  On-going      PT LONG TERM GOAL #2   Title  urinary leakage with coughing and sneezing decreased >/= 80% due to increased pelvic floor strength    Time  12    Period  Weeks    Status  Achieved      PT LONG TERM GOAL #3   Title  urinary leakage during intercourse decreased >/= 80% due to increased pelvic floor strength    Baseline  not happen yet    Time  12    Period  Weeks    Status  On-going      PT LONG TERM GOAL #4   Title  anal pain with bowel movement decreased >/= 75% due to improve tissue mobility    Time  12    Period  Weeks    Status  Achieved            Plan - 09/23/19 1154    Clinical Impression Statement  Patient is able to fully empty her bladder and easier to initiate a urine stream. Patient has a stronger urine stream. Patient has leaked a little more due to her muscles are elongated better. Patient will only leak with activity. Patient is now able to contract the pelvic floor with cough in sitting but will not automatically contract with laugh. Patient will fatique with  maximum contraction. Patient is able to hold a steady 50% contraction for 10 seconds in sitting and standing. Resting tone is 2 uv. Patient is able to contract to 20 uv in sitting.  Patient will benefit from skilled therapy to improve strength and coordination of the pelvic floor to reduce prolapse.    Examination-Activity Limitations  Continence;Toileting    Stability/Clinical Decision Making  Evolving/Moderate complexity    Rehab Potential  Excellent    PT Frequency  1x / week    PT Duration  12 weeks    PT Treatment/Interventions  Biofeedback;Electrical Stimulation;Cryotherapy;Moist Heat;Ultrasound;Therapeutic exercise;Therapeutic activities;Neuromuscular re-education;Patient/family education;Dry needling;Manual techniques    PT Next Visit Plan  pelvic floor EMG in sitting and standing with control    PT Home Exercise Plan  Access Code: [email or web address]    Consulted and Agree with Plan of Care  Patient       Patient will benefit from skilled therapeutic intervention in order to improve the following deficits and impairments:  Increased fascial restricitons, Decreased endurance, Increased muscle spasms, Decreased activity tolerance, Pain, Impaired flexibility, Decreased strength  Visit Diagnosis: Cramp and spasm  Muscle weakness (generalized)  Other lack of coordination  Stress incontinence (female) (female)  Anal pain     Problem List Patient Active Problem List   Diagnosis Date Noted  . Anal fissure 09/24/2018  . Anal pain 09/24/2018  . Rectal bleeding 09/24/2018  . Frequent headaches 07/31/2018  . GERD (gastroesophageal reflux disease) 07/31/2018  . High blood pressure 07/31/2018  . Migraines 07/31/2018  . Urine incontinence 07/31/2018  . Urinary tract infection 07/31/2018    Earlie Counts, PT 09/23/19 12:30 PM   [facility] [address] [place], [place], [address] Phone: [phone]   Fax:   [phone]  Name: Margaret Rich MRN: [id number] Date  of Birth: [birth year]

## 2019-09-30 ENCOUNTER — Ambulatory Visit: Payer: BC Managed Care – PPO | Admitting: Physical Therapy

## 2019-10-01 ENCOUNTER — Encounter: Payer: Self-pay | Admitting: Physical Therapy

## 2019-10-07 ENCOUNTER — Encounter: Payer: Self-pay | Admitting: Physical Therapy

## 2019-10-07 ENCOUNTER — Other Ambulatory Visit: Payer: Self-pay

## 2019-10-07 ENCOUNTER — Ambulatory Visit: Payer: BC Managed Care – PPO | Admitting: Physical Therapy

## 2019-10-07 DIAGNOSIS — R252 Cramp and spasm: Secondary | ICD-10-CM | POA: Diagnosis not present

## 2019-10-07 DIAGNOSIS — M6281 Muscle weakness (generalized): Secondary | ICD-10-CM

## 2019-10-07 DIAGNOSIS — K6289 Other specified diseases of anus and rectum: Secondary | ICD-10-CM

## 2019-10-07 DIAGNOSIS — R278 Other lack of coordination: Secondary | ICD-10-CM

## 2019-10-07 DIAGNOSIS — N393 Stress incontinence (female) (male): Secondary | ICD-10-CM

## 2019-10-07 NOTE — Therapy (Signed)
[facility] [address], [place], [place], [address] Phone: [phone]   Fax:  [phone]  Physical Therapy Treatment  Patient Details  Name: Margaret Rich MRN: [id number] Date of Birth: August 19, 1981 Referring Provider (PT): Darliss Cheney, CNM   Encounter Date: 10/07/2019  PT End of Session - 10/07/19 1625    Visit Number  7    Date for PT Re-Evaluation  10/23/19    Authorization Type  BCBS    PT Start Time  1884   came late   PT Stop Time  1700    PT Time Calculation (min)  35 min    Activity Tolerance  Patient tolerated treatment well;No increased pain    Behavior During Therapy  [facility] for tasks assessed/performed       History reviewed. No pertinent past medical history.  History reviewed. No pertinent surgical history.  There were no vitals filed for this visit.  Subjective Assessment - 10/07/19 1626    Subjective  No reported leakage. No issues with emptying bladder. No rectal pain.    Patient Stated Goals  reduce the urinary continence, improve pelvic floor tone, reduce anal pain    Currently in Pain?  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, walking, lifting feed      Cognition   Overall Cognitive Status  Within Functional Limits for tasks assessed      Strength   Right Hip Flexion  5/5    Right Hip External Rotation    5/5    Right Hip ABduction  5/5    Left Hip Flexion  5/5    Left Hip External Rotation  5/5    Left Hip ABduction  5/5      Palpation   SI assessment   no pelvic rotation                Pelvic Floor Special Questions - 10/07/19 0001    Urinary Leakage  No    Prolapse  Anterior Wall    Pelvic Floor Internal Exam  Patient confirms identification and approves PT to assess the pelvic floor and treatment    Exam Type  Vaginal    Strength  good squeeze, good lift, able to hold agaisnt strong resistance        [facility] Adult PT Treatment/Exercise - 10/07/19 0001      Self-Care   Self-Care  Other Self-Care Comments    Other Self-Care Comments   education on cystocele, rectocele and splinting to have a bowel movement      Manual Therapy   Manual Therapy  Soft tissue mobilization;Internal Pelvic Floor    Soft tissue mobilization  circular massage around the anus  to relax the external anal sphincter, along the obturator internist and medial side of the left ischial tuberosity    Internal Pelvic Floor  soft tissue work to the levator ani             PT Education - 10/07/19 1659    Education Details  education on rectocele, splinting, cystocele, and advancement of the pelvic floor strength    Person(s) Educated  Patient    Methods  Explanation;Demonstration;Handout    Comprehension  Verbalized understanding;Returned demonstration       PT Short Term Goals - 08/26/19 1153      PT SHORT TERM GOAL #1   Title  independent with initial HEP    Time  4    Period  Weeks    Status  Achieved    Target Date  08/28/19      PT SHORT TERM GOAL #2   Title  urinary leakage with coughing and sneezing decreased >/= 25%    Time  4    Period  Weeks    Status  Achieved    Target Date  08/28/19      PT SHORT TERM GOAL #3   Title  urinary leakage with intercourse decreased >/= 25%    Time  4    Period  Weeks    Status  Achieved    Target Date  08/28/19      PT SHORT TERM GOAL  #4   Title  understand how  valsalva manuever can strain pelvic floor and increase prolapse and increase urinary leakage with intercourse    Time  4    Period  Weeks    Status  Achieved    Target Date  08/28/19      PT SHORT TERM GOAL #5   Title  understand ways to manage uterine prolapse to prevent progression    Time  4    Period  Weeks    Status  Achieved    Target Date  08/28/19        PT Long Term Goals - 10/07/19 1704      PT LONG TERM GOAL #1   Title  independent with HEP and how to progress herself    Time  12    Period  Weeks    Status  Achieved      PT LONG TERM GOAL #2   Title  urinary leakage with coughing and sneezing decreased >/= 80% due to increased pelvic floor strength    Time  12    Period  Weeks    Status  Achieved      PT LONG TERM GOAL #3   Title  urinary leakage during intercourse decreased >/= 80% due to increased pelvic floor strength    Time  12    Period  Weeks    Status  Achieved      PT LONG TERM GOAL #4   Title  anal pain with bowel movement decreased >/= 75% due to improve tissue mobility    Time  12    Period  Weeks    Status  Achieved            Plan - 10/07/19 1704    Clinical Impression Statement  Patient has not urinary leakage. Pelvic floor strength is 4/5. Patient is not having rectal pain. Patient strength of bilateral hips is 5/5. Patient understands how to splint due to prolpase to assist with a bowel movement. Patient is independent with her HEP.  Patient has a weakening of the anterior and posterior wall of the vagina. Patient is independent with HEP.    Examination-Activity Limitations  Continence;Toileting    Stability/Clinical Decision Making  Evolving/Moderate complexity    Rehab Potential  Excellent    PT Frequency  1x / week    PT Duration  12 weeks    PT Treatment/Interventions  Biofeedback;Electrical Stimulation;Cryotherapy;Moist Heat;Ultrasound;Therapeutic exercise;Therapeutic activities;Neuromuscular  re-education;Patient/family education;Dry needling;Manual techniques    PT Next Visit Plan  discharge to HEP    PT Home Exercise Plan  Access Code: [email or web address]    Consulted and Agree with Plan of Care  Patient       Patient will benefit from skilled therapeutic intervention in order to improve the following deficits and impairments:  Increased fascial restricitons, Decreased endurance, Increased muscle spasms, Decreased activity tolerance, Pain, Impaired flexibility, Decreased strength  Visit Diagnosis: Cramp and spasm  Muscle weakness (generalized)  Other lack of coordination  Stress incontinence (female) (female)  Anal pain     Problem List Patient Active Problem List   Diagnosis Date Noted  . Anal fissure 09/24/2018  . Anal pain 09/24/2018  . Rectal bleeding 09/24/2018  . Frequent headaches 07/31/2018  . GERD (gastroesophageal reflux disease) 07/31/2018  . High blood pressure 07/31/2018  . Migraines 07/31/2018  . Urine incontinence 07/31/2018  . Urinary tract infection 07/31/2018    Earlie Counts, PT 10/07/19 5:08 PM    [facility] [address] [place], [place], [address] Phone: [phone]   Fax:  [phone]  Name: Margaret Rich MRN: [id number] Date of Birth: June 28, 1981  PHYSICAL THERAPY DISCHARGE SUMMARY  Visits from Start of Care: 7  Current functional level related to goals / functional outcomes: See above.    Remaining deficits: See above.    Education / Equipment: HEP Plan: Patient agrees to discharge.  Patient goals were met. Patient is being discharged due to meeting the stated rehab goals.  Thank you for the referral. Earlie Counts, PT 10/07/19 5:09 PM  ?????

## 2019-10-07 NOTE — Patient Instructions (Addendum)
   [email or web address]    Slow Contraction: Gravity Resisted (Sitting)    Sitting, slowly squeeze pelvic floor for _15-20__ seconds. Rest for _5__ seconds. Repeat _5__ times. Do _2__ times a day. End with 5 quick flicks Copyright  VHI. All rights reserved.  Slow Contraction: Gravity Resisted (Standing)    Standing, slowly squeeze pelvic floor for 10___ seconds. Rest for _5__ seconds. Repeat _5__ times. Do _2__ times a day. End with 5 quick flicks Copyright  VHI. All rights reserved.  [place] [address], [place], [place] [address] Phone # [phone] Fax [phone]

## 2019-10-21 ENCOUNTER — Encounter: Payer: BC Managed Care – PPO | Admitting: Physical Therapy

## 2020-01-29 IMAGING — CT CT PELVIS W/ CM
2 of 3 series · 17 of 46 positions shown, 19 images · IV contrast (ISOVUE 300)
Comparison: None.

CLINICAL DATA: Anal pain and rectal bleeding.  Anal fissure.

EXAM:
CT PELVIS WITH CONTRAST
TECHNIQUE: Multidetector CT imaging of the pelvis was performed using the
standard protocol following the bolus administration of intravenous
contrast.
CONTRAST:  100mL 7QMBJT-733 IOPAMIDOL ([id number]-733) INJECTION 61%

[Series 2: pelvis w · axial · 0.66mm/px · z∈[+715,+955]mm · 14 of 56 slices shown, 16 images]
[im 4/56  soft-tissue]
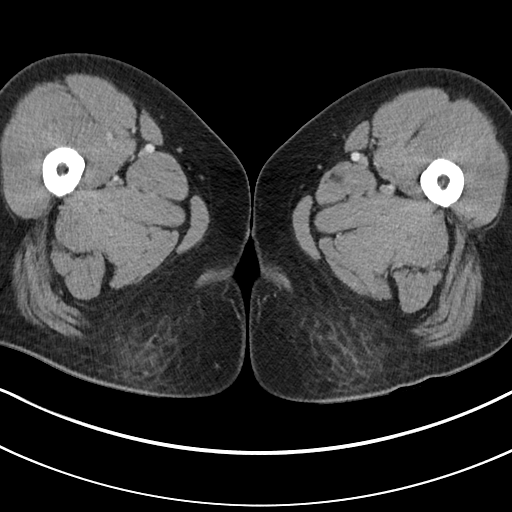
[im 4/56  bone]
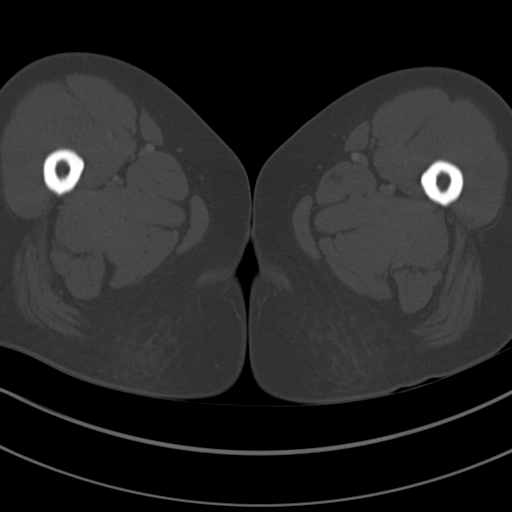
[im 8/56  soft-tissue]
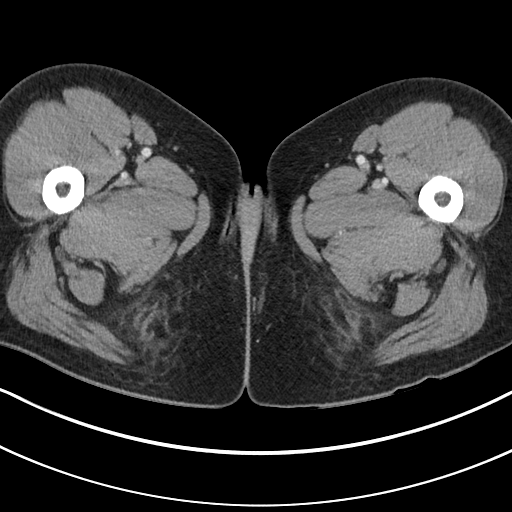
[im 11/56  soft-tissue]
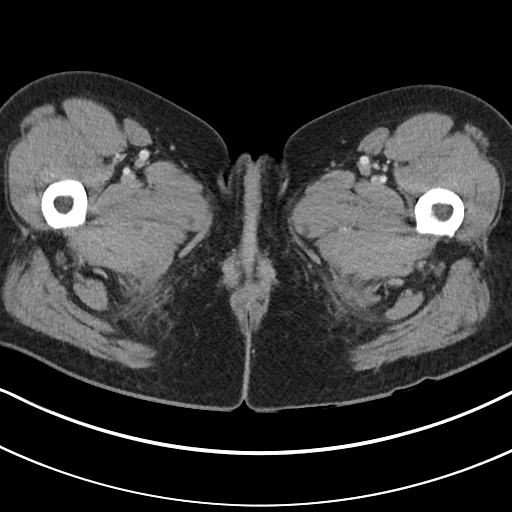
[im 15/56  soft-tissue]
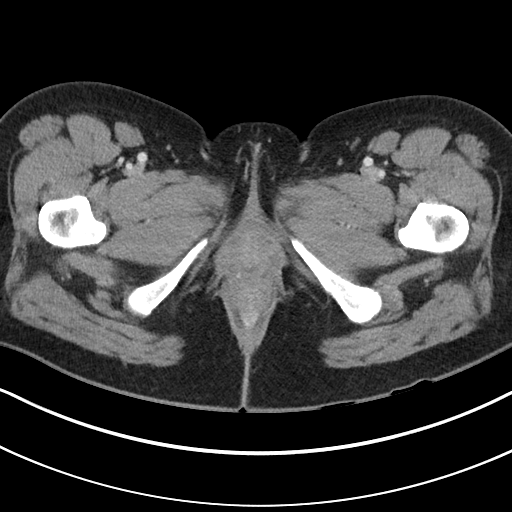
[im 18/56  soft-tissue]
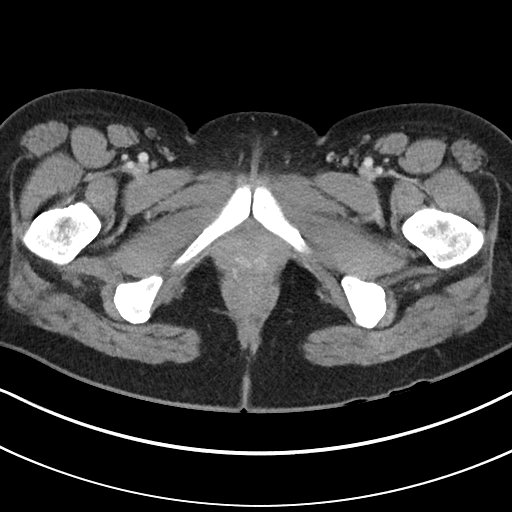
[im 22/56  soft-tissue]
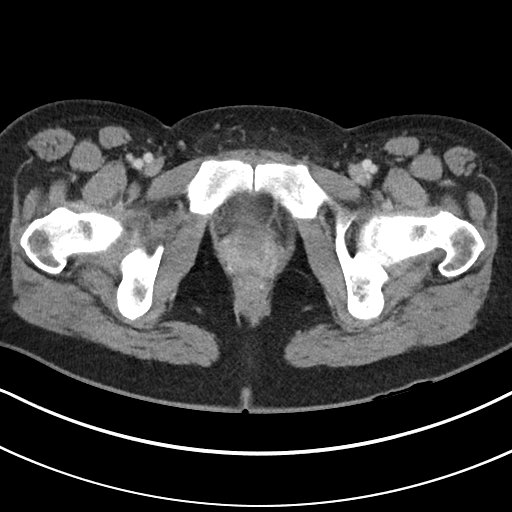
[im 25/56  soft-tissue]
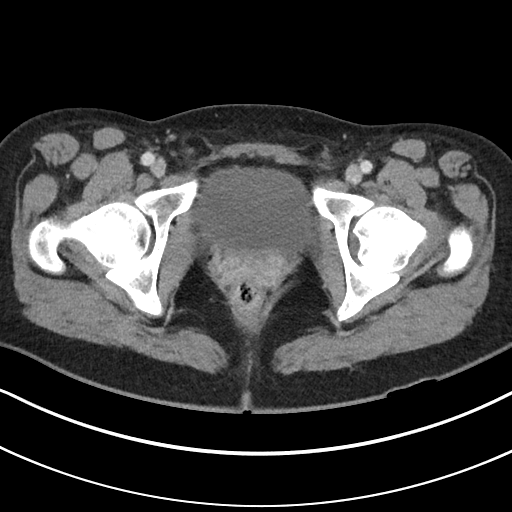
[im 31/56  soft-tissue]
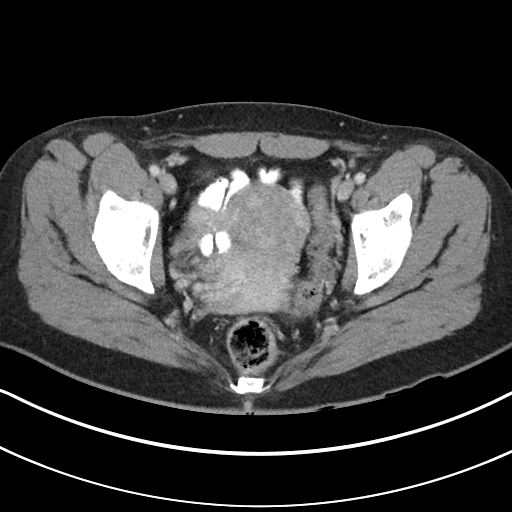
[im 34/56  soft-tissue]
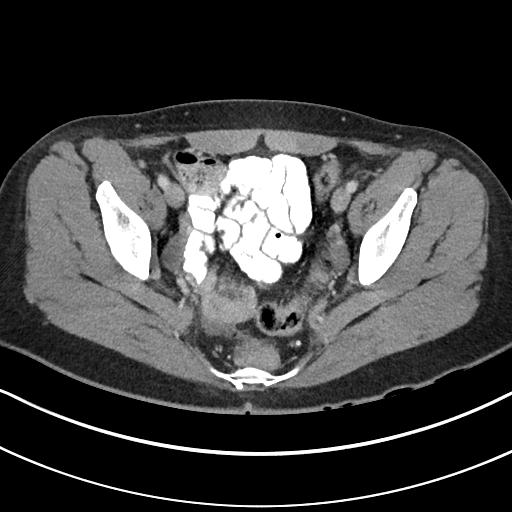
[im 34/56  bone]
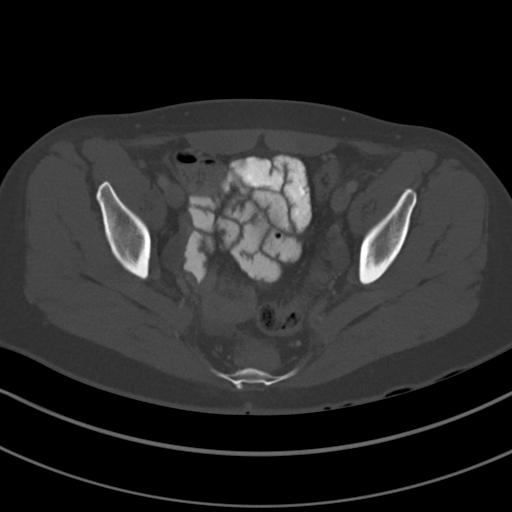
[im 38/56  soft-tissue]
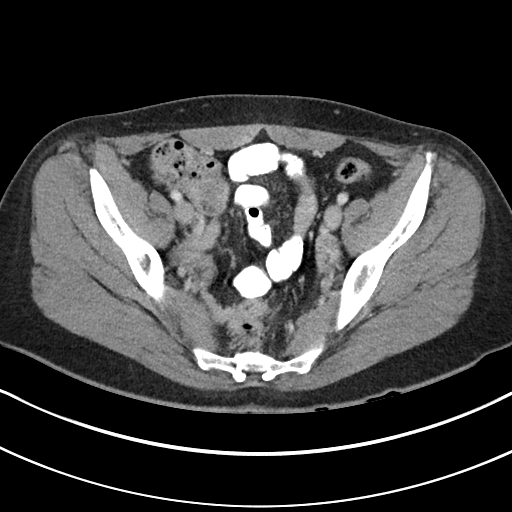
[im 41/56  soft-tissue]
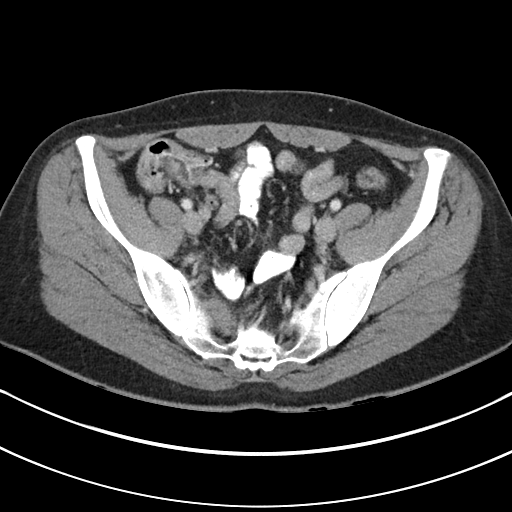
[im 45/56  soft-tissue]
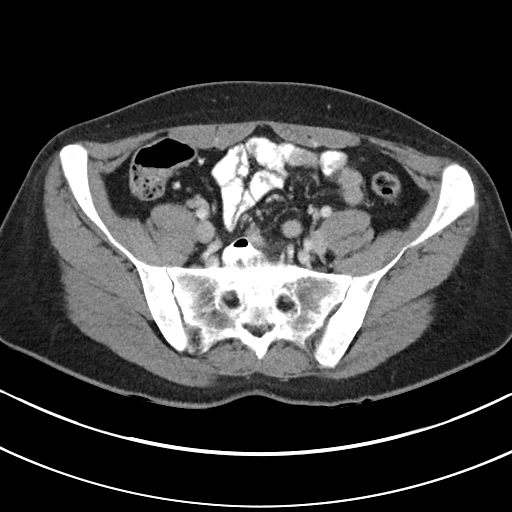
[im 48/56  soft-tissue]
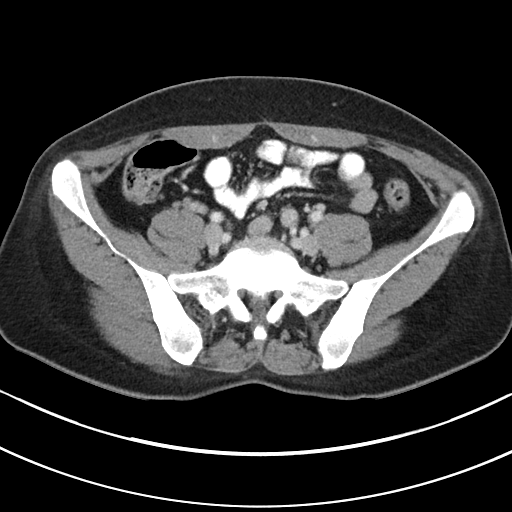
[im 52/56  soft-tissue]
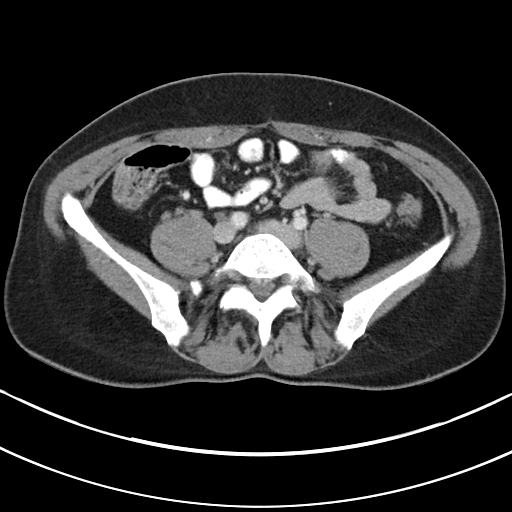

[Series 4: pelvis w st · coronal · 0.56mm/px · 3 of 71 slices shown]
[im 24/71  soft-tissue]
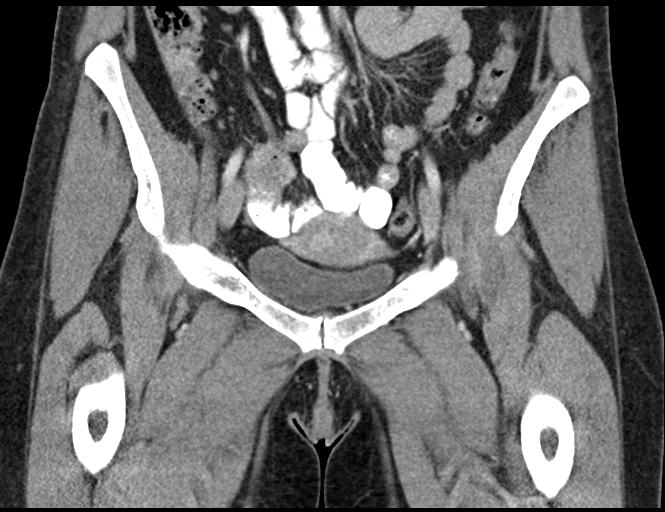
[im 32/71  soft-tissue]
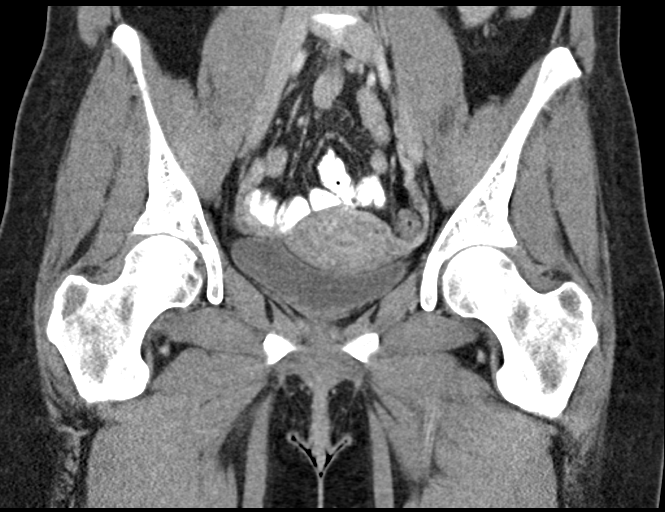
[im 39/71  soft-tissue]
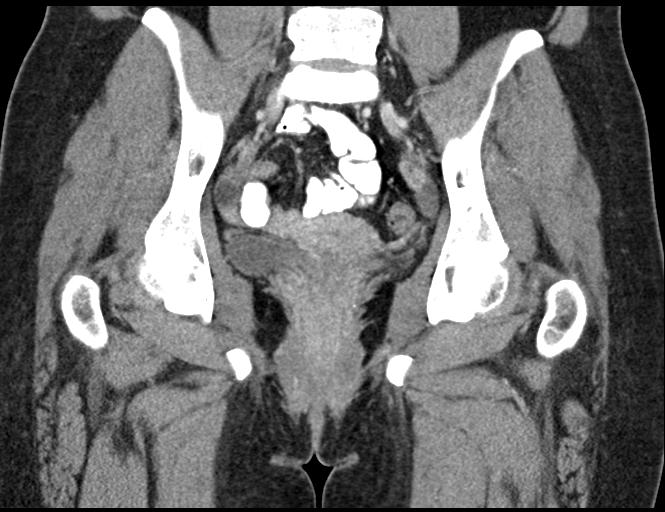

[17 of 46 positions shown; findings below may reference images not displayed]

FINDINGS: Urinary Tract: Unremarkable urinary bladder.

Bowel: Unremarkable pelvic bowel loops. No evidence of acute
perianal inflammatory changes or abscess.

Vascular/Lymphatic: No pathologically enlarged lymph nodes or other
significant abnormality.

Reproductive:  No mass or other significant abnormality.

Other: None.

Musculoskeletal: No significant abnormality identified.
IMPRESSION: Negative. No evidence of perianal abscess or other significant
abnormality.

## 2020-01-30 ENCOUNTER — Other Ambulatory Visit: Payer: Self-pay | Admitting: Physician Assistant

## 2020-01-30 DIAGNOSIS — K625 Hemorrhage of anus and rectum: Secondary | ICD-10-CM

## 2020-01-30 DIAGNOSIS — K602 Anal fissure, unspecified: Secondary | ICD-10-CM

## 2020-01-30 DIAGNOSIS — K6289 Other specified diseases of anus and rectum: Secondary | ICD-10-CM

## 2020-05-11 ENCOUNTER — Encounter: Payer: Self-pay | Admitting: Family

## 2020-05-11 ENCOUNTER — Ambulatory Visit: Payer: BC Managed Care – PPO | Admitting: Family

## 2020-05-11 ENCOUNTER — Other Ambulatory Visit: Payer: Self-pay

## 2020-05-11 VITALS — BP 122/84 | HR 101 | Temp 98.3°F | Ht 68.0 in | Wt 182.8 lb

## 2020-05-11 DIAGNOSIS — R635 Abnormal weight gain: Secondary | ICD-10-CM

## 2020-05-11 DIAGNOSIS — R350 Frequency of micturition: Secondary | ICD-10-CM | POA: Diagnosis not present

## 2020-05-11 DIAGNOSIS — R5383 Other fatigue: Secondary | ICD-10-CM

## 2020-05-11 DIAGNOSIS — Z1321 Encounter for screening for nutritional disorder: Secondary | ICD-10-CM | POA: Diagnosis not present

## 2020-05-11 LAB — COMPREHENSIVE METABOLIC PANEL
ALT: 17 U/L (ref 0–35)
AST: 16 U/L (ref 0–37)
Albumin: 4.6 g/dL (ref 3.5–5.2)
Alkaline Phosphatase: 55 U/L (ref 39–117)
BUN: 13 mg/dL (ref 6–23)
CO2: 27 mEq/L (ref 19–32)
Calcium: 9.3 mg/dL (ref 8.4–10.5)
Chloride: 106 mEq/L (ref 96–112)
Creatinine, Ser: 0.75 mg/dL (ref 0.40–1.20)
GFR: 85.12 mL/min (ref >60.00)
Glucose, Bld: 93 mg/dL (ref 70–99)
Potassium: 3.9 mEq/L (ref 3.5–5.1)
Sodium: 139 mEq/L (ref 135–145)
Total Bilirubin: 0.5 mg/dL (ref 0.2–1.2)
Total Protein: 7.2 g/dL (ref 6.0–8.3)

## 2020-05-11 LAB — CBC WITH DIFFERENTIAL/PLATELET
Basophils Absolute: 0 10*3/uL (ref 0.0–0.1)
Basophils Relative: 0.6 % (ref 0.0–3.0)
Eosinophils Absolute: 0.2 10*3/uL (ref 0.0–0.7)
Eosinophils Relative: 2.5 % (ref 0.0–5.0)
HCT: 38.3 % (ref 36.0–46.0)
Hemoglobin: 12.7 g/dL (ref 12.0–15.0)
Lymphocytes Relative: 21 % (ref 12.0–46.0)
Lymphs Abs: 1.6 10*3/uL (ref 0.7–4.0)
MCHC: 33.3 g/dL (ref 30.0–36.0)
MCV: 85.4 fl (ref 78.0–100.0)
Monocytes Absolute: 0.5 10*3/uL (ref 0.1–1.0)
Monocytes Relative: 7.1 % (ref 3.0–12.0)
Neutro Abs: 5.2 10*3/uL (ref 1.4–7.7)
Neutrophils Relative %: 68.8 % (ref 43.0–77.0)
Platelets: 188 10*3/uL (ref 150.0–400.0)
RBC: 4.48 Mil/uL (ref 3.87–5.11)
RDW: 14.9 % (ref 11.5–15.5)
WBC: 7.5 10*3/uL (ref 4.0–10.5)

## 2020-05-11 LAB — VITAMIN D 25 HYDROXY (VIT D DEFICIENCY, FRACTURES): VITD: 29.94 ng/mL — ABNORMAL LOW (ref 30.00–100.00)

## 2020-05-11 LAB — TSH: TSH: 3.61 u[IU]/mL (ref 0.35–4.50)

## 2020-05-11 LAB — VITAMIN B12: Vitamin B-12: 522 pg/mL (ref 211–911)

## 2020-05-11 LAB — HEMOGLOBIN A1C: Hgb A1c MFr Bld: 5.5 % (ref 4.6–6.5)

## 2020-05-11 NOTE — Progress Notes (Signed)
Jamaria Amborn is a 39 y.o. female with the following history as recorded in EpicCare:  Patient Active Problem List   Diagnosis Date Noted  . Anal fissure 09/24/2018  . Anal pain 09/24/2018  . Rectal bleeding 09/24/2018  . Frequent headaches 07/31/2018  . GERD (gastroesophageal reflux disease) 07/31/2018  . High blood pressure 07/31/2018  . Migraines 07/31/2018  . Urine incontinence 07/31/2018  . Urinary tract infection 07/31/2018    Current Outpatient Medications  Medication Sig Dispense Refill  . CRANBERRY PO Take by mouth.    . IRON, FERROUS SULFATE, PO Take 10 mg by mouth daily.    . Magnesium 70 MG CAPS Take 3 capsules by mouth daily.     . vitamin C (ASCORBIC ACID) 500 MG tablet Take 500 mg by mouth daily.    . cholecalciferol (VITAMIN D) 1000 units tablet Take 1,000 Units by mouth 2 (two) times daily.    . Probiotic Product (PROBIOTIC DAILY PO) Take by mouth.     No current facility-administered medications for this visit.    Allergies: Patient has no known allergies.  History reviewed. No pertinent past medical history.  History reviewed. No pertinent surgical history.  Family History  Problem Relation Age of Onset  . Alcohol abuse Father   . Cancer Father   . Alcohol abuse Brother   . Cancer Maternal Grandmother   . Diabetes Maternal Grandmother   . High blood pressure Maternal Grandmother   . Alcohol abuse Maternal Grandfather   . Heart disease Maternal Grandfather   . High Cholesterol Maternal Grandfather   . High blood pressure Maternal Grandfather   . Alcohol abuse Paternal Grandmother   . Alcohol abuse Paternal Grandfather   . Cancer Paternal Grandfather   . Colon cancer Neg Hx   . Esophageal cancer Neg Hx   . Inflammatory bowel disease Neg Hx   . Liver disease Neg Hx   . Pancreatic cancer Neg Hx   . Rectal cancer Neg Hx   . Stomach cancer Neg Hx     Social History   Tobacco Use  . Smoking status: Former Smoker    Years: 5.00    Quit date:  05/18/2008    Years since quitting: 11.9  . Smokeless tobacco: Never Used  . Tobacco comment: Social smoker;   Substance Use Topics  . Alcohol use: Yes    Subjective:  Presents with concerns for fatigue; "feeling like I am more tired than normal." Has 65 yo son and 70 yo son; admits that has not slept well for a while- son has not been a good sleeper; has gained almost 20 pounds in the past year; feels like diet is good; gets 6000 steps in per day but does not have specific exercise routine; does snore at night;  M, MGM has thyroid condition;   LMP Now; vasectomy +Breast-feeding;    Objective:  Vitals:   05/11/20 0858 05/11/20 0926  BP: 122/84   Pulse: (!) 101   Temp: 98.3 F (36.8 C)   TempSrc: Oral   SpO2: 98%   Weight: 192 lb 6.4 oz (87.3 kg) 182 lb 12.8 oz (82.9 kg)  Height: _0  (1.727 m)     General: Well developed, well nourished, in no acute distress  Skin : Warm and dry.  Head: Normocephalic and atraumatic  Lungs: Respirations unlabored; clear to auscultation bilaterally without wheeze, rales, rhonchi  CVS exam: normal rate and regular rhythm.  Abdomen: Soft; nontender; nondistended; normoactive bowel sounds; no masses or  hepatosplenomegaly  Musculoskeletal: No deformities; no active joint inflammation  Extremities: No edema, cyanosis, clubbing  Vessels: Symmetric bilaterally  Neurologic: Alert and oriented; speech intact; face symmetrical; moves all extremities well; CNII-XII intact without focal deficit   Assessment:  1. Other fatigue   2. Urinary frequency   3. Weight gain   4. Encounter for vitamin deficiency screening     Plan:   ? Etiology; son has not been a good sleeper until recently- household is now starting to get better sleep; not getting regular consistent exercise; encouraged dental follow-up as this can affect physical health; may need to consider sleep study for her; follow-up to be determined.  This visit occurred during the SARS-CoV-2 public  health emergency.  Safety protocols were in place, including screening questions prior to the visit, additional usage of staff PPE, and extensive cleaning of exam room while observing appropriate contact time as indicated for disinfecting solutions.     No follow-ups on file.  Orders Placed This Encounter  Procedures  . Urine Culture  . CBC with Differential/Platelet  . Comp Met (CMET)  . TSH  . VITAMIN D 25 Hydroxy (Vit-D Deficiency, Fractures)  . HgB A1c  . B12    Requested Prescriptions    No prescriptions requested or ordered in this encounter

## 2020-05-12 LAB — URINE CULTURE

## 2020-05-19 ENCOUNTER — Encounter: Payer: Self-pay | Admitting: Family

## 2020-12-14 ENCOUNTER — Other Ambulatory Visit: Payer: BC Managed Care – PPO

## 2020-12-14 DIAGNOSIS — Z20822 Contact with and (suspected) exposure to covid-19: Secondary | ICD-10-CM

## 2020-12-15 LAB — SARS-COV-2, NAA 2 DAY TAT

## 2020-12-15 LAB — NOVEL CORONAVIRUS, NAA: SARS-CoV-2, NAA: NOT DETECTED

## 2021-01-05 ENCOUNTER — Encounter: Payer: Self-pay | Admitting: Family

## 2021-01-17 NOTE — Progress Notes (Unsigned)
Subjective:   I, Philbert Riser, LAT, ATC acting as a scribe for Clementeen Graham, MD.  I'm seeing this patient as a consultation for Margaret Gee, FNP. Note will be routed back to referring provider/PCP.  CC: Right forearm and R hand pain  HPI: Pt is a 40 y/o female c/o pain in R forearm and hand ongoing for the past 2-3 months and progressively getting worse. No known MOI. 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 Pt locates pain to an achy pain across wrist that radiates up arm and into shoulder at times.   She notes the pain is focused more in her proximal forearm and anterior elbow.  Numbness/tingling: yes in her R 3rd and 4th fingers and hand Grip weakness: no Aggravates: pronation, fine motor activities- writing, stirring, and lifting things Rx tried: none   Past medical history, Surgical history, Family history, Social history, Allergies, and medications have been entered into the medical record, reviewed.   Review of Systems: No new headache, visual changes, nausea, vomiting, diarrhea, constipation, dizziness, abdominal pain, skin rash, fevers, chills, night sweats, weight loss, swollen lymph nodes, body aches, joint swelling, muscle aches, chest pain, shortness of breath, mood changes, visual or auditory hallucinations.   Objective:    Vitals:   01/18/21 1021  BP: 110/80  Pulse: (!) 101  SpO2: 99%   General: Well Developed, well nourished, and in no acute distress.  Neuro/Psych: Alert and oriented x3, extra-ocular muscles intact, able to move all 4 extremities, sensation grossly intact. Skin: Warm and dry, no rashes noted.  Respiratory: Not using accessory muscles, speaking in full sentences, trachea midline.  Cardiovascular: Pulses palpable, no extremity edema. Abdomen: Does not appear distended. MSK: C-spine normal. Nontender cervical midline. Normal cervical motion. Extremity strength reflexes and sensation are equal and normal  throughout.  Right elbow normal. Nontender epicondyles. Tender palpation distal biceps tendon insertion onto proximal radius. Some pain with resisted supination and elbow flexion.  Strength is intact however.  Wrist and hand normal-appearing nontender normal grip strength pulses and capillary refill and sensation. Positive Tinel's at carpal tunnel.  Lab and Radiology Results  Procedure: Real-time Ultrasound Guided hydrodissection of median nerve at right carpal tunnel Device: Philips Affiniti 50G Images permanently stored and available for review in PACS Ultrasound inspection of carpal tunnel prior to injection reveals large median nerve measuring 20 mm.  This is characteristic for moderate to severe carpal tunnel syndrome. Verbal informed consent obtained.  Discussed risks and benefits of procedure. Warned about infection bleeding damage to structures skin hypopigmentation and fat atrophy among others. Patient expresses understanding and agreement Time-out conducted.   Noted no overlying erythema, induration, or other signs of local infection.   Skin prepped in a sterile fashion.   Local anesthesia: Topical Ethyl chloride.   With sterile technique and under real time ultrasound guidance:  40 mg of Kenalog and 1 mL of lidocaine injected into left tissue around the median nerve. Fluid seen entering the carpal tunnel.   Completed without difficulty   Pain immediately resolved suggesting accurate placement of the medication.   Advised to call if fevers/chills, erythema, induration, drainage, or persistent bleeding.   Images permanently stored and available for review in the ultrasound unit.  Impression: Technically successful ultrasound guided injection.    Impression and Recommendations:    Assessment and Plan: 40 y.o. female with right carpal tunnel syndrome worsening over the past few weeks.  Plan for wrist  brace gabapentin and injection as above.  If failing to improve proceed  with nerve conduction study.  Additionally patient has an proximal biceps tendinitis.  Plan for home exercise program teaching in clinic today.  If not improving would consider formal hand physical therapy.  Recheck in about 6 weeks.Marland Kitchen  PDMP not reviewed this encounter. Orders Placed This Encounter  Procedures  . [place] LIMITED JOINT SPACE STRUCTURES UP RIGHT(NO LINKED CHARGES)    Standing Status:   Future    Number of Occurrences:   1    Standing Expiration Date:   07/18/2021    Order Specific Question:   Reason for Exam (SYMPTOM  OR DIAGNOSIS REQUIRED)    Answer:   right arm pain    Order Specific Question:   Preferred imaging location?    Answer:   [facility] ordered this encounter  Medications  . gabapentin (NEURONTIN) 300 MG capsule    Sig: Take 1 capsule (300 mg total) by mouth 3 (three) times daily as needed.    Dispense:  90 capsule    Refill:  3    Discussed warning signs or symptoms. Please see discharge instructions. Patient expresses understanding.   The above documentation has been reviewed and is accurate and complete Clementeen Graham, M.D.

## 2021-01-18 ENCOUNTER — Ambulatory Visit: Payer: Self-pay

## 2021-01-18 ENCOUNTER — Other Ambulatory Visit: Payer: Self-pay

## 2021-01-18 ENCOUNTER — Encounter: Payer: Self-pay | Admitting: Family Medicine

## 2021-01-18 ENCOUNTER — Ambulatory Visit (INDEPENDENT_AMBULATORY_CARE_PROVIDER_SITE_OTHER): Payer: BC Managed Care – PPO | Admitting: Family Medicine

## 2021-01-18 VITALS — BP 110/80 | HR 101 | Ht 68.0 in | Wt 192.6 lb

## 2021-01-18 DIAGNOSIS — M79601 Pain in right arm: Secondary | ICD-10-CM

## 2021-01-18 DIAGNOSIS — G5601 Carpal tunnel syndrome, right upper limb: Secondary | ICD-10-CM | POA: Diagnosis not present

## 2021-01-18 DIAGNOSIS — M7521 Bicipital tendinitis, right shoulder: Secondary | ICD-10-CM

## 2021-01-18 MED ORDER — GABAPENTIN 300 MG PO CAPS: 300.0000 mg | ORAL_CAPSULE | Freq: Three times a day (TID) | ORAL | 3 refills | Status: DC | PRN

## 2021-01-18 NOTE — Patient Instructions (Addendum)
Thank you for coming in today.  Call or go to the ER if you develop a large red swollen joint with extreme pain or oozing puss.   Use gabapentin at bedtime as needed.   Use the wrist brace at bedtime.   Do home exercises for distal biceps tendonitis. View at [email or web address]-exercise-code.[email or web address] using code: [email or web address]  If not improving let me know.    Carpal Tunnel Syndrome  Carpal tunnel syndrome is a condition that causes pain, numbness, and weakness in your hand and fingers. The carpal tunnel is a narrow area located on the palm side of your wrist. Repeated wrist motion or certain diseases may cause swelling within the tunnel. This swelling pinches the main nerve in the wrist. The main nerve in the wrist is called the median nerve. What are the causes? This condition may be caused by:  Repeated and forceful wrist and hand motions.  Wrist injuries.  Arthritis.  A cyst or tumor in the carpal tunnel.  Fluid buildup during pregnancy.  Use of tools that vibrate. Sometimes the cause of this condition is not known. What increases the risk? The following factors may make you more likely to develop this condition:  Having a job that requires you to repeatedly or forcefully move your wrist or hand or requires you to use tools that vibrate. This may include jobs that involve using computers, working on an [employer], or working with power tools such as Radiographer, therapeutic.  Being a woman.  Having certain conditions, such as: ? Diabetes. ? Obesity. ? An underactive thyroid (hypothyroidism). ? Kidney failure. ? Rheumatoid arthritis. What are the signs or symptoms? Symptoms of this condition include:  A tingling feeling in your fingers, especially in your thumb, index, and middle fingers.  Tingling or numbness in your hand.  An aching feeling in your entire arm, especially when your wrist and elbow are bent for a long time.  Wrist pain that goes up your arm to your shoulder.  Pain that goes  down into your palm or fingers.  A weak feeling in your hands. You may have trouble grabbing and holding items. Your symptoms may feel worse during the night. How is this diagnosed? This condition is diagnosed with a medical history and physical exam. You may also have tests, including:  Electromyogram (EMG). This test measures electrical signals sent by your nerves into the muscles.  Nerve conduction study. This test measures how well electrical signals pass through your nerves.  Imaging tests, such as X-rays, ultrasound, and MRI. These tests check for possible causes of your condition. How is this treated? This condition may be treated with:  Lifestyle changes. It is important to stop or change the activity that caused your condition.  Doing exercise and activities to strengthen and stretch your muscles and tendons (physical therapy).  Making lifestyle changes to help with your condition and learning how to do your daily activities safely (occupational therapy).  Medicines for pain and inflammation. This may include medicine that is injected into your wrist.  A wrist splint or brace.  Surgery. Follow these instructions at home: If you have a splint or brace:  Wear the splint or brace as told by your health care provider. Remove it only as told by your health care provider.  Loosen the splint or brace if your fingers tingle, become numb, or turn cold and blue.  Keep the splint or brace clean.  If the splint or brace is not waterproof: ? Do not let  it get wet. ? Cover it with a watertight covering when you take a bath or shower. Managing pain, stiffness, and swelling If directed, put ice on the painful area. To do this:  If you have a removeable splint or brace, remove it as told by your health care provider.  Put ice in a plastic bag.  Place a towel between your skin and the bag or between the splint or brace and the bag.  Leave the ice on for 20 minutes, 2-3 times a  day. Do not fall asleep with the cold pack on your skin.  Remove the ice if your skin turns bright red. This is very important. If you cannot feel pain, heat, or cold, you have a greater risk of damage to the area. Move your fingers often to reduce stiffness and swelling.   General instructions  Take over-the-counter and prescription medicines only as told by your health care provider.  Rest your wrist and hand from any activity that may be causing your pain. If your condition is work related, talk with your employer about changes that can be made, such as getting a wrist pad to use while typing.  Do any exercises as told by your health care provider, physical therapist, or occupational therapist.  Keep all follow-up visits. This is important. Contact a health care provider if:  You have new symptoms.  Your pain is not controlled with medicines.  Your symptoms get worse. Get help right away if:  You have severe numbness or tingling in your wrist or hand. Summary  Carpal tunnel syndrome is a condition that causes pain, numbness, and weakness in your hand and fingers.  It is usually caused by repeated wrist motions.  Lifestyle changes and medicines are used to treat carpal tunnel syndrome. Surgery may be recommended.  Follow your health care provider's instructions about wearing a splint, resting from activity, keeping follow-up visits, and calling for help. This information is not intended to replace advice given to you by your health care provider. Make sure you discuss any questions you have with your health care provider. Document Revised: 04/15/2020 Document Reviewed: 04/15/2020 Elsevier Patient Education  2021 [employer].

## 2021-02-07 ENCOUNTER — Encounter: Payer: Self-pay | Admitting: Family Medicine

## 2021-02-17 ENCOUNTER — Encounter: Payer: Self-pay | Admitting: Family

## 2021-06-01 ENCOUNTER — Ambulatory Visit: Payer: BC Managed Care – PPO | Admitting: Internal Medicine

## 2021-06-01 ENCOUNTER — Other Ambulatory Visit: Payer: Self-pay

## 2021-06-01 ENCOUNTER — Encounter: Payer: Self-pay | Admitting: Internal Medicine

## 2021-06-01 VITALS — BP 126/74 | HR 76 | Temp 98.8°F | Resp 16 | Ht 68.0 in | Wt 190.4 lb

## 2021-06-01 DIAGNOSIS — L03221 Cellulitis of neck: Secondary | ICD-10-CM

## 2021-06-01 MED ORDER — CEPHALEXIN 500 MG PO CAPS: 500.0000 mg | ORAL_CAPSULE | Freq: Four times a day (QID) | ORAL | 0 refills | Status: DC

## 2021-06-01 MED ORDER — HYDROCORTISONE 2.5 % EX CREA
TOPICAL_CREAM | Freq: Two times a day (BID) | CUTANEOUS | 0 refills | Status: DC
Start: 2021-06-01 — End: 2021-06-23

## 2021-06-01 NOTE — Progress Notes (Signed)
   Subjective:    Patient ID: Margaret Rich, female    DOB: 22-Mar-1981, 40 y.o.   MRN: [id number]  DOS:  06/01/2021 Type of visit - description: Acute visit  Noted redness and some itching at the left supraclavicular area 3 to 4 days ago. This morning, she noted some striking spreading down.  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 Denies any other rashes anywhere.  Review of Systems See above   No past medical history on file.  No past surgical history on file.  Allergies as of 06/01/2021   No Known Allergies      Medication List        Accurate as of June 01, 2021 11:59 PM. If you have any questions, ask your nurse or doctor.          STOP taking these medications    cholecalciferol 1000 units tablet Commonly known as: VITAMIN D Stopped by: Willow Ora, MD   gabapentin 300 MG capsule Commonly known as: NEURONTIN Stopped by: Willow Ora, MD   IRON (FERROUS SULFATE) PO Stopped by: Willow Ora, MD   PROBIOTIC DAILY PO Stopped by: Willow Ora, MD       TAKE these medications    cephALEXin 500 MG capsule Commonly known as: KEFLEX Take 1 capsule (500 mg total) by mouth 4 (four) times daily. Started by: Willow Ora, MD   CRANBERRY PO Take by mouth.   hydrocortisone 2.5 % cream Apply topically 2 (two) times daily. Started by: Willow Ora, MD   Magnesium 400 MG Caps Take by mouth. What changed: Another medication with the same name was removed. Continue taking this medication, and follow the directions you see here. Changed by: Willow Ora, MD   vitamin C 500 MG tablet Commonly known as: ASCORBIC ACID Take 500 mg by mouth daily.           Objective:   Physical Exam BP 126/74 (BP Location: Left Arm, Patient Position: Sitting, Cuff Size: Small)   Pulse 76   Temp 98.8 F (37.1 C) (Oral)   Resp 16   Ht 5\' 8"  (1.727 m)   Wt 190 lb 6 oz (86.4 kg)   LMP 05/27/2021 (Exact Date)   SpO2 98%   BMI 28.95 kg/m   General:   Well developed, NAD, BMI noted. HEENT:  Normocephalic . Face symmetric, atraumatic Skin: Some redness and induration at the L supraclavicular area, no abscess, no opening, no discharge.  Area slightly warm.  She does indeed has some streaking going down.  See picture. Neurologic:  alert & oriented X3.  Speech normal, gait appropriate for age and unassisted Psych--  Cognition and judgment appear intact.  Cooperative with normal attention span and concentration.  Behavior appropriate. No anxious or depressed appearing.       Assessment      40 year old female, PMH includes HTN, CTS, frequent headaches, presents with  Early cellulitis: Symptoms as described above, there is some induration but also itching.  Early cellulitis versus contact dermatitis (location would be atypical for contact derm). Plan: Cephalexin, topical steroids, call if not gradually better.   This visit occurred during the SARS-CoV-2 public health emergency.  Safety protocols were in place, including screening questions prior to the visit, additional usage of staff PPE, and extensive cleaning of exam room while observing appropriate contact time as indicated for disinfecting solutions.

## 2021-06-01 NOTE — Patient Instructions (Signed)
Take antibiotics for 5 days as prescribed  Use the cream for itching  Call if not gradually better  Call if severe symptoms

## 2021-06-17 ENCOUNTER — Telehealth: Payer: Self-pay

## 2021-06-17 NOTE — Telephone Encounter (Signed)
Called pt back and she stated that she got what she needed. She called another office and it was routed to [place] by accident. Pt concern has been taken care of.

## 2021-06-17 NOTE — Telephone Encounter (Signed)
Caller states that she needs to make an appointment. Declined triage.

## 2021-06-21 ENCOUNTER — Ambulatory Visit: Payer: BC Managed Care – PPO | Admitting: Family

## 2021-06-23 ENCOUNTER — Encounter: Payer: Self-pay | Admitting: Family

## 2021-06-23 ENCOUNTER — Ambulatory Visit: Payer: BC Managed Care – PPO | Admitting: Family

## 2021-06-23 ENCOUNTER — Other Ambulatory Visit: Payer: Self-pay

## 2021-06-23 VITALS — BP 122/68 | HR 87 | Temp 98.8°F | Ht 67.0 in | Wt 190.2 lb

## 2021-06-23 DIAGNOSIS — R109 Unspecified abdominal pain: Secondary | ICD-10-CM | POA: Diagnosis not present

## 2021-06-23 DIAGNOSIS — R7989 Other specified abnormal findings of blood chemistry: Secondary | ICD-10-CM | POA: Diagnosis not present

## 2021-06-23 LAB — COMPREHENSIVE METABOLIC PANEL
ALT: 15 U/L (ref 0–35)
AST: 15 U/L (ref 0–37)
Albumin: 4.6 g/dL (ref 3.5–5.2)
Alkaline Phosphatase: 59 U/L (ref 39–117)
BUN: 12 mg/dL (ref 6–23)
CO2: 23 mEq/L (ref 19–32)
Calcium: 9.3 mg/dL (ref 8.4–10.5)
Chloride: 106 mEq/L (ref 96–112)
Creatinine, Ser: 0.77 mg/dL (ref 0.40–1.20)
GFR: 95.29 mL/min (ref >60.00)
Glucose, Bld: 79 mg/dL (ref 70–99)
Potassium: 3.8 mEq/L (ref 3.5–5.1)
Sodium: 139 mEq/L (ref 135–145)
Total Bilirubin: 0.6 mg/dL (ref 0.2–1.2)
Total Protein: 7.3 g/dL (ref 6.0–8.3)

## 2021-06-23 LAB — CBC WITH DIFFERENTIAL/PLATELET
Basophils Absolute: 0.1 10*3/uL (ref 0.0–0.1)
Basophils Relative: 0.7 % (ref 0.0–3.0)
Eosinophils Absolute: 0.1 10*3/uL (ref 0.0–0.7)
Eosinophils Relative: 1.5 % (ref 0.0–5.0)
HCT: 36.6 % (ref 36.0–46.0)
Hemoglobin: 12.1 g/dL (ref 12.0–15.0)
Lymphocytes Relative: 26.5 % (ref 12.0–46.0)
Lymphs Abs: 2.2 10*3/uL (ref 0.7–4.0)
MCHC: 33 g/dL (ref 30.0–36.0)
MCV: 82.5 fl (ref 78.0–100.0)
Monocytes Absolute: 0.6 10*3/uL (ref 0.1–1.0)
Monocytes Relative: 7.3 % (ref 3.0–12.0)
Neutro Abs: 5.3 10*3/uL (ref 1.4–7.7)
Neutrophils Relative %: 64 % (ref 43.0–77.0)
Platelets: 204 10*3/uL (ref 150.0–400.0)
RBC: 4.43 Mil/uL (ref 3.87–5.11)
RDW: 15.4 % (ref 11.5–15.5)
WBC: 8.3 10*3/uL (ref 4.0–10.5)

## 2021-06-23 MED ORDER — PANTOPRAZOLE SODIUM 40 MG PO TBEC: 40.0000 mg | DELAYED_RELEASE_TABLET | Freq: Every day | ORAL | 0 refills | Status: DC

## 2021-06-23 NOTE — Patient Instructions (Signed)
Gastritis, Adult Gastritis is inflammation of the stomach. There are two kinds of gastritis: Acute gastritis. This kind develops suddenly. Chronic gastritis. This kind is much more common and lasts for a long time. Gastritis happens when the lining of the stomach becomes weak or gets damaged.Without treatment, gastritis can lead to stomach bleeding and ulcers. What are the causes? This condition may be caused by: An infection. Drinking too much alcohol. Certain medicines. These include steroids, antibiotics, and some over-the-counter medicines, such as aspirin or ibuprofen. Having too much acid in the stomach. A disease of the intestines or stomach. Stress. An allergic reaction. Crohn's disease. Some cancer treatments (radiation). Sometimes the cause of this condition is not known. What are the signs or symptoms? Symptoms of this condition include: Pain or a burning sensation in the upper abdomen. Nausea. Vomiting. An uncomfortable feeling of fullness after eating. Weight loss. Bad breath. Blood in your vomit or stools. In some cases, there are no symptoms. How is this diagnosed? This condition may be diagnosed with: Your medical history and a description of your symptoms. A physical exam. Tests. These can include: Blood tests. Stool tests. A test in which a thin, flexible instrument with a light and a camera is passed down the esophagus and into the stomach (upper endoscopy). A test in which a sample of tissue is taken for testing (biopsy). How is this treated? This condition may be treated with medicines. The medicines that are used vary depending on the cause of the gastritis: If the condition is caused by a bacterial infection, you may be given antibiotic medicines. If the condition is caused by too much acid in the stomach, you may be given medicines called H2 blockers, proton pump inhibitors, or antacids. Treatment may also involve stopping the use of certain medicines,  such asaspirin, ibuprofen, or other NSAIDs. Follow these instructions at home: Medicines Take over-the-counter and prescription medicines only as told by your health care provider. If you were prescribed an antibiotic medicine, take it as told by your health care provider. Do not stop taking the antibiotic even if you start to feel better. Eating and drinking  Eat small, frequent meals instead of large meals. Avoid foods and drinks that make your symptoms worse. Drink enough fluid to keep your urine pale yellow.  Alcohol use Do not drink alcohol if: Your health care provider tells you not to drink. You are pregnant, may be pregnant, or are planning to become pregnant. If you drink alcohol: Limit your use to: 0-1 drink a day for women. 0-2 drinks a day for men. Be aware of how much alcohol is in your drink. In the [place] one drink equals one 12 oz bottle of beer (355 mL), one 5 oz glass of wine (148 mL), or one 1 oz glass of hard liquor (44 mL). General instructions Talk with your health care provider about ways to manage stress, such as getting regular exercise or practicing deep breathing, meditation, or yoga. Do not use any products that contain nicotine or tobacco, such as cigarettes and e-cigarettes. If you need help quitting, ask your health care provider. Keep all follow-up visits as told by your health care provider. This is important. Contact a health care provider if: Your symptoms get worse. Your symptoms return after treatment. Get help right away if: You vomit blood or material that looks like coffee grounds. You have black or dark red stools. You are unable to keep fluids down. Your abdominal pain gets worse. You have a  fever. You do not feel better after one week. Summary Gastritis is inflammation of the lining of the stomach that can occur suddenly (acute) or develop slowly over time (chronic). This condition is diagnosed with a medical history, a physical exam, or  tests. This condition may be treated with medicines to treat infection or medicines to reduce the amount of acid in your stomach. Follow your health care provider's instructions about taking medicines, making changes to your diet, and knowing when to call for help. This information is not intended to replace advice given to you by your health care provider. Make sure you discuss any questions you have with your healthcare provider. Document Revised: 04/23/2018 Document Reviewed: 04/23/2018 Elsevier Patient Education  2022 [employer].

## 2021-06-23 NOTE — Progress Notes (Signed)
Margaret Rich is a 40 y.o. female with the following history as recorded in [employer]:  Patient Active Problem List   Diagnosis Date Noted   Carpal tunnel syndrome, right 01/18/2021   Biceps tendinitis of right upper extremity 01/18/2021   Anal fissure 09/24/2018   Anal pain 09/24/2018   Rectal bleeding 09/24/2018   Frequent headaches 07/31/2018   GERD (gastroesophageal reflux disease) 07/31/2018   High blood pressure 07/31/2018   Migraines 07/31/2018   Urine incontinence 07/31/2018   Urinary tract infection 07/31/2018    Current Outpatient Medications  Medication Sig Dispense Refill   pantoprazole (PROTONIX) 40 MG tablet Take 1 tablet (40 mg total) by mouth daily. 30 tablet 0   No current facility-administered medications for this visit.    Allergies: Patient has no known allergies.  No past medical history on file.  No past surgical history on file.  Family History  Problem Relation Age of Onset   Alcohol abuse Father    Cancer Father    Alcohol abuse Brother    Cancer Maternal Grandmother    Diabetes Maternal Grandmother    High blood pressure Maternal Grandmother    Alcohol abuse Maternal Grandfather    Heart disease Maternal Grandfather    High Cholesterol Maternal Grandfather    High blood pressure Maternal Grandfather    Alcohol abuse Paternal Grandmother    Alcohol abuse Paternal Grandfather    Cancer Paternal Grandfather    Colon cancer Neg Hx    Esophageal cancer Neg Hx    Inflammatory bowel disease Neg Hx    Liver disease Neg Hx    Pancreatic cancer Neg Hx    Rectal cancer Neg Hx    Stomach cancer Neg Hx     Social History   Tobacco Use   Smoking status: Former    Years: 5.00    Pack years: 0.00    Types: Cigarettes    Quit date: 05/18/2008    Years since quitting: 13.1   Smokeless tobacco: Never   Tobacco comments:    Social smoker;   Substance Use Topics   Alcohol use: Yes    Subjective:  Intermittent abdominal pain x 3-4 weeks; "describes  as fluttery sensation." Unable to determine any triggers; no nausea or vomiting; has had some intermittent diarrhea;  Did take 5 day course of Keflex prior to onset of symptoms; does have some previous issues with reflux but has not had to take medication.      Objective:  Vitals:   06/23/21 0954  BP: 122/68  Pulse: 87  Temp: 98.8 F (37.1 C)  TempSrc: Oral  SpO2: 99%  Weight: 190 lb 3.2 oz (86.3 kg)  Height: 5' 7" (1.702 m)    General: Well developed, well nourished, in no acute distress  Skin : Warm and dry.  Head: Normocephalic and atraumatic  Eyes: Sclera and conjunctiva clear; pupils round and reactive to light; extraocular movements intact  Ears: External normal; canals clear; tympanic membranes normal  Oropharynx: Pink, supple. No suspicious lesions  Neck: Supple without thyromegaly, adenopathy  Lungs: Respirations unlabored; clear to auscultation bilaterally without wheeze, rales, rhonchi  CVS exam: normal rate and regular rhythm.  Abdomen: Soft; nontender; nondistended; normoactive bowel sounds; no masses or hepatosplenomegaly  Neurologic: Alert and oriented; speech intact; face symmetrical; moves all extremities well; CNII-XII intact without focal deficit   Assessment:  1. Abdominal pain, unspecified abdominal location   2. Abnormal thyroid stimulating hormone level     Plan:  ?  Gastritis from recent antibiotic use; check CBC, CMP today; trial of Protonix 40 mg qd x 10-14 days; update abdominal ultrasound; Due to family history of thyroid disease, check full thyroid panel;  This visit occurred during the SARS-CoV-2 public health emergency.  Safety protocols were in place, including screening questions prior to the visit, additional usage of staff PPE, and extensive cleaning of exam room while observing appropriate contact time as indicated for disinfecting solutions.    No follow-ups on file.  Orders Placed This Encounter  Procedures   US Abdomen Complete     Standing Status:   Future    Standing Expiration Date:   06/23/2022    Order Specific Question:   Reason for Exam (SYMPTOM  OR DIAGNOSIS REQUIRED)    Answer:   abdominal pain    Order Specific Question:   Preferred imaging location?    Answer:   [facility]   CBC with Differential/Platelet   Comp Met (CMET)   Thyroid Panel With TSH    Requested Prescriptions   Signed Prescriptions Disp Refills   pantoprazole (PROTONIX) 40 MG tablet 30 tablet 0    Sig: Take 1 tablet (40 mg total) by mouth daily.

## 2021-06-24 LAB — THYROID PANEL WITH TSH
Free Thyroxine Index: 1.7 (ref 1.4–3.8)
T3 Uptake: 27 % (ref 22–35)
T4, Total: 6.3 ug/dL (ref 5.1–11.9)
TSH: 3.36 mIU/L

## 2021-06-28 ENCOUNTER — Ambulatory Visit: Payer: BC Managed Care – PPO | Admitting: Family Medicine

## 2021-06-28 ENCOUNTER — Other Ambulatory Visit: Payer: Self-pay

## 2021-06-28 ENCOUNTER — Ambulatory Visit: Payer: Self-pay

## 2021-06-28 ENCOUNTER — Encounter: Payer: Self-pay | Admitting: Family Medicine

## 2021-06-28 VITALS — BP 120/78 | HR 86 | Ht 67.0 in | Wt 189.2 lb

## 2021-06-28 DIAGNOSIS — M249 Joint derangement, unspecified: Secondary | ICD-10-CM | POA: Diagnosis not present

## 2021-06-28 DIAGNOSIS — G5601 Carpal tunnel syndrome, right upper limb: Secondary | ICD-10-CM

## 2021-06-28 NOTE — Patient Instructions (Signed)
Thank you for coming in today.   Call or go to the ER if you develop a large red swollen joint with extreme pain or oozing puss.    You should hear about the nerve conduction study soon.   Recheck as needed.

## 2021-06-28 NOTE — Progress Notes (Signed)
I, Christoper Fabian, LAT, ATC, am serving as scribe for Dr. Clementeen Graham.  Margaret Rich is a 40 y.o. female who presents to [facility] at [facility] today for f/u R carpal tunnel syndrome and R proximal biceps tendinopathy. Pt was last seen by Dr. Denyse Amass on 02/07/21 and was given a R carpal tunnel steroid injection and advised to wear a wrist brace and was prescribed gabapentin. Additionally, pt was taught HEP for her biceps. Today, pt reports that her R hand numbness has returned over the last 3 weeks.  She notices the numbness w/ repetitive motions as w/ cooking.  She also wanted to check on some old injuries to make sure everything is ok with those. She reports having sprained her R lateral ankle a couple years ago and fractured a bone in her L foot a few years ago.   Pertinent review of systems: No fevers or chills  Relevant historical information: Right carpal tunnel syndrome   Exam:  BP 120/78 (BP Location: Right Arm, Patient Position: Sitting, Cuff Size: Normal)   Pulse 86   Ht 5\' 7"  (1.702 m)   Wt 189 lb 3.2 oz (85.8 kg)   SpO2 98%   BMI 29.63 kg/m  General: Well Developed, well nourished, and in no acute distress.   MSK: Right wrist normal-appearing nontender positive Tinel's. Left foot normal-appearing nontender normal foot motion.  Right ankle slightly puffy swollen at ATFL region Nontender. Slight laxity.  Beighton hypermobility score 9/9   Lab and Radiology Results  Procedure: Real-time Ultrasound Guided hydrodissection median nerve right carpal tunnel Device: Philips Affiniti 50G Images permanently stored and available for review in PACS Verbal informed consent obtained.  Discussed risks and benefits of procedure. Warned about infection bleeding damage to structures skin hypopigmentation and fat atrophy among others. Patient expresses understanding and agreement Time-out conducted.   Noted no overlying erythema, induration, or other signs of  local infection.   Skin prepped in a sterile fashion.   Local anesthesia: Topical Ethyl chloride.   With sterile technique and under real time ultrasound guidance: 40 mg of Kenalog and 1 mL of lidocaine injected into carpal tunnel around median nerve. Fluid seen entering the carpal tunnel.   Completed without difficulty   Pain immediately resolved suggesting accurate placement of the medication.   Advised to call if fevers/chills, erythema, induration, drainage, or persistent bleeding.   Images permanently stored and available for review in the ultrasound unit.  Impression: Technically successful ultrasound guided injection.         Assessment and Plan: 40 y.o. female with recurrent right carpal tunnel syndrome.  Patient had last injection January 18, 2021 and is now having recurrent symptoms.  I am fearful that these injections are not lasting long enough.  We will go ahead and repeat injection today but arrange for nerve conduction study to be done in about 2 months.  This will set her up so that she can have surgery should her symptoms return.  She is also hypermobile and is at risk for future ankle sprains.  Recommend using ankle braces and proceed with a home exercise program for ankle stability when she is doing heavy-duty exercises.   PDMP not reviewed this encounter. Orders Placed This Encounter  Procedures   January 20, 2021 LIMITED JOINT SPACE STRUCTURES UP RIGHT(NO LINKED CHARGES)    Order Specific Question:   Reason for Exam (SYMPTOM  OR DIAGNOSIS REQUIRED)    Answer:   R hand numbness    Order  Specific Question:   Preferred imaging location?    Answer:   [facility] referral to Neurology    Referral Priority:   Routine    Referral Type:   Consultation    Referral Reason:   Specialty Services Required    Requested Specialty:   Neurology    Number of Visits Requested:   1   NCV with EMG(electromyography)    Standing Status:   Future     Standing Expiration Date:   06/28/2022    Order Specific Question:   Where should this test be performed?    Answer:   [facility]   No orders of the defined types were placed in this encounter.    Discussed warning signs or symptoms. Please see discharge instructions. Patient expresses understanding.   The above documentation has been reviewed and is accurate and complete Clementeen Graham, M.D.

## 2021-06-29 ENCOUNTER — Encounter: Payer: Self-pay | Admitting: Neurology

## 2021-07-03 ENCOUNTER — Encounter: Payer: Self-pay | Admitting: Family

## 2021-07-07 ENCOUNTER — Encounter: Payer: Self-pay | Admitting: Family

## 2021-07-14 ENCOUNTER — Other Ambulatory Visit: Payer: Self-pay

## 2021-07-14 ENCOUNTER — Ambulatory Visit
Admission: RE | Admit: 2021-07-14 | Discharge: 2021-07-14 | Disposition: A | Discharge status: Home / Self Care | Payer: BC Managed Care – PPO | Source: Ambulatory Visit | Attending: Family | Admitting: Family

## 2021-07-14 DIAGNOSIS — R109 Unspecified abdominal pain: Secondary | ICD-10-CM

## 2021-07-18 ENCOUNTER — Encounter: Payer: Self-pay | Admitting: Family

## 2021-07-19 ENCOUNTER — Other Ambulatory Visit: Payer: Self-pay | Admitting: Family

## 2021-07-19 DIAGNOSIS — R1010 Upper abdominal pain, unspecified: Secondary | ICD-10-CM

## 2021-07-25 ENCOUNTER — Telehealth: Payer: Self-pay

## 2021-07-25 NOTE — Telephone Encounter (Signed)
I have spoken to the pt and informed her if [facility] Rieger stated that a GI consult is needed then it would be a good option for her to see them. The GI docs see stomach ulcers and they will have a better idea of what type of diet is good for gut health. Pt stated that the Co-pay is for speciality is high and wants to try some medications to start. I have informed her that is not un-reasonable, however it will still be a good idea to get a Gi doc if she has a history of these ulcers. Pt stated understanding and will try to get the appointment  and give [place] a call back to give [place] status update.

## 2021-07-25 NOTE — Telephone Encounter (Signed)
Pt called stating she would like to discuss with you, via a quick telephone call, possible meds to try before going for the consult with GI.

## 2021-08-30 ENCOUNTER — Encounter: Payer: BC Managed Care – PPO | Admitting: Neurology

## 2021-09-28 ENCOUNTER — Encounter: Payer: Self-pay | Admitting: Family

## 2021-11-30 DIAGNOSIS — F909 Attention-deficit hyperactivity disorder, unspecified type: Secondary | ICD-10-CM | POA: Insufficient documentation

## 2022-01-02 ENCOUNTER — Encounter: Payer: Self-pay | Admitting: Physician Assistant

## 2022-01-02 ENCOUNTER — Telehealth: Payer: BC Managed Care – PPO | Admitting: Physician Assistant

## 2022-01-02 DIAGNOSIS — G43109 Migraine with aura, not intractable, without status migrainosus: Secondary | ICD-10-CM | POA: Diagnosis not present

## 2022-01-02 MED ORDER — SUMATRIPTAN SUCCINATE 25 MG PO TABS: 25.0000 mg | ORAL_TABLET | ORAL | 0 refills | Status: DC | PRN

## 2022-01-02 MED ORDER — ONDANSETRON 4 MG PO TBDP: 4.0000 mg | ORAL_TABLET | Freq: Three times a day (TID) | ORAL | 0 refills | Status: DC | PRN

## 2022-01-02 NOTE — Patient Instructions (Signed)
Margaret Rich, thank you for joining Margaret Loveless, Margaret Rich for today's virtual visit.  While this provider is not your primary care provider (PCP), if your PCP is located in our provider database this encounter information will be shared with them immediately following your visit.  Consent: (Patient) Margaret Rich provided verbal consent for this virtual visit at the beginning of the encounter.  Current Medications:  Current Outpatient Medications:    pantoprazole (PROTONIX) 40 MG tablet, Take 1 tablet (40 mg total) by mouth daily., Disp: 30 tablet, Rfl: 0   Medications ordered in this encounter:  No orders of the defined types were placed in this encounter.    *If you need refills on other medications prior to your next appointment, please contact your pharmacy*  Follow-Up: Call back or seek an in-person evaluation if the symptoms worsen or if the condition fails to improve as anticipated.  Other Instructions Sumatriptan Tablets What is this medication? SUMATRIPTAN (soo ma TRIP tan) treats migraines. It works by blocking pain signals and narrowing blood vessels in the brain. It belongs to a group of medications called triptans. It is not used to prevent migraines. This medicine may be used for other purposes; ask your health care provider or pharmacist if you have questions. COMMON BRAND NAME(S): Imitrex, Migraine Pack What should I tell my care team before I take this medication? They need to know if you have any of these conditions: Cigarette smoker Circulation problems in fingers and toes Heart disease High blood pressure High blood sugar (diabetes) High cholesterol History of irregular heartbeat History of stroke Kidney disease Liver disease Stomach or intestine problems An unusual or allergic reaction to sumatriptan, other medications, foods, dyes, or preservatives Pregnant or trying to get pregnant Breast-feeding How should I use this medication? Take this  medication by mouth with a glass of water. Follow the directions on the prescription label. Do not take it more often than directed. Talk to your care team regarding the use of this medication in children. Special care may be needed. Overdosage: If you think you have taken too much of this medicine contact a poison control center or emergency room at once. NOTE: This medicine is only for you. Do not share this medicine with others. What if I miss a dose? This does not apply. This medication is not for regular use. What may interact with this medication? Do not take this medication with any of the following: Certain medications for migraine headache like almotriptan, eletriptan, frovatriptan, naratriptan, rizatriptan, sumatriptan, zolmitriptan Ergot alkaloids like dihydroergotamine, ergonovine, ergotamine, methylergonovine MAOIs like Carbex, Eldepryl, Marplan, Nardil, and Parnate This medication may also interact with the following: Certain medications for depression, anxiety, or psychotic disorders This list may not describe all possible interactions. Give your health care provider a list of all the medicines, herbs, non-prescription drugs, or dietary supplements you use. Also tell them if you smoke, drink alcohol, or use illegal drugs. Some items may interact with your medicine. What should I watch for while using this medication? Visit your care team for regular checks on your progress. Tell your care team if your symptoms do not start to get better or if they get worse. You may get drowsy or dizzy. Do not drive, use machinery, or do anything that needs mental alertness until you know how this medication affects you. Do not stand up or sit up quickly, especially if you are an older patient. This reduces the risk of dizzy or fainting spells. Alcohol may  interfere with the effect of this medication. Tell your care team right away if you have any change in your eyesight. If you take migraine  medications for 10 or more days a month, your migraines may get worse. Keep a diary of headache days and medication use. Contact your care team if your migraine attacks occur more frequently. What side effects may I notice from receiving this medication? Side effects that you should report to your care team as soon as possible: Allergic reactions--skin rash, itching, hives, swelling of the face, lips, tongue, or throat Burning, pain, tingling, or color changes in the legs or feet Heart attack--pain or tightness in the chest, shoulders, arms, or jaw, nausea, shortness of breath, cold or clammy skin, feeling faint or lightheaded Heart rhythm changes--fast or irregular heartbeat, dizziness, feeling faint or lightheaded, chest pain, trouble breathing Increase in blood pressure Raynaud's--cool, numb, or painful fingers or toes that may change color from pale, to blue, to red Seizures Serotonin syndrome--irritability, confusion, fast or irregular heartbeat, muscle stiffness, twitching muscles, sweating, high fever, seizure, chills, vomiting, diarrhea Stroke--sudden numbness or weakness of the face, arm, or leg, trouble speaking, confusion, trouble walking, loss of balance or coordination, dizziness, severe headache, change in vision Sudden or severe stomach pain, nausea, vomiting, fever, or bloody diarrhea Vision loss Side effects that usually do not require medical attention (report to your care team if they continue or are bothersome): Dizziness General discomfort or fatigue This list may not describe all possible side effects. Call your doctor for medical advice about side effects. You may report side effects to FDA at 1-800-FDA-1088. Where should I keep my medication? Keep out of the reach of children and pets. Store at room temperature between 2 and 30 degrees C (36 and 86 degrees F). Throw away any unused medication after the expiration date. NOTE: This sheet is a summary. It may not cover all  possible information. If you have questions about this medicine, talk to your doctor, pharmacist, or health care provider.  2022 Elsevier/Gold Standard (2020-12-31 00:00:00)    If you have been instructed to have an in-person evaluation today at a local Urgent Care facility, please use the link below. It will take you to a list of all of our available [facility], including address, phone number and hours of operation. Please do not delay care.  [facility]  If you or a family member do not have a primary care provider, use the link below to schedule a visit and establish care. When you choose a [facility] primary care physician or advanced practice provider, you gain a long-term partner in health. Find a Primary Care Provider  Learn more about [facility]'s in-office and virtual care options: [facility] - Get Care Now

## 2022-01-02 NOTE — Progress Notes (Signed)
Virtual Visit Consent   Margaret Rich, you are scheduled for a virtual visit with a [facility] provider today.     Just as with appointments in the office, your consent must be obtained to participate.  Your consent will be active for this visit and any virtual visit you may have with one of our providers in the next 365 days.     If you have a MyChart account, a copy of this consent can be sent to you electronically.  All virtual visits are billed to your insurance company just like a traditional visit in the office.    As this is a virtual visit, video technology does not allow for your provider to perform a traditional examination.  This may limit your provider's ability to fully assess your condition.  If your provider identifies any concerns that need to be evaluated in person or the need to arrange testing (such as labs, EKG, etc.), we will make arrangements to do so.     Although advances in technology are sophisticated, we cannot ensure that it will always work on either your end or our end.  If the connection with a video visit is poor, the visit may have to be switched to a telephone visit.  With either a video or telephone visit, we are not always able to ensure that we have a secure connection.     I need to obtain your verbal consent now.   Are you willing to proceed with your visit today?    Margaret Rich has provided verbal consent on 01/02/2022 for a virtual visit (video or telephone).   Margaret Loveless, PA-C   Date: 01/02/2022 7:05 PM   Virtual Visit via Video Note   I, Margaret Rich, connected with  Margaret Rich  ([id number], 06-05-1981) on 01/02/22 at  6:45 PM EST by a video-enabled telemedicine application and verified that I am speaking with the correct person using two identifiers.  Location: Patient: Virtual Visit Location Patient: Home Provider: Virtual Visit Location Provider: Home Office   I discussed the limitations of evaluation and  management by telemedicine and the availability of in person appointments. The patient expressed understanding and agreed to proceed.    History of Present Illness: Margaret Rich is a 41 y.o. who identifies as a female who was assigned female at birth, and is being seen today for headache.  HPI: Headache  This is a new problem. The current episode started today. The problem occurs constantly. The problem has been unchanged. The pain is located in the Frontal region. The pain radiates to the face. The pain quality is similar to prior headaches. The quality of the pain is described as aching. Associated symptoms include coughing (mild), nausea, photophobia, sinus pressure, a visual change (in the beginning) and vomiting (x 1 after tylenol). Pertinent negatives include no blurred vision, dizziness, ear pain, eye pain, eye redness, eye watering, fever, hearing loss, loss of balance, muscle aches, numbness, phonophobia, scalp tenderness, sore throat, tinnitus or weakness. Associated symptoms comments: Visual aura prior to onset. The symptoms are aggravated by activity. She has tried acetaminophen and NSAIDs for the symptoms. The treatment provided no relief. There is no history of cluster headaches, hypertension, migraine headaches, migraines in the family or recent head traumas.   Never diagnosed with migraines. Headache started this morning with visual aura. Has had headaches in the past with visual auras. Normally able to take Ibuprofen with relief. Today headache has not improved.  Also mentions she has recently stopped medication for ADHD, but tapered safely and was only on this medication for a month. Today was first day without medication.   Problems:  Patient Active Problem List   Diagnosis Date Noted   Carpal tunnel syndrome, right 01/18/2021   Biceps tendinitis of right upper extremity 01/18/2021   Anal fissure 09/24/2018   Anal pain 09/24/2018   Rectal bleeding 09/24/2018   Frequent  headaches 07/31/2018   GERD (gastroesophageal reflux disease) 07/31/2018   High blood pressure 07/31/2018   Migraines 07/31/2018   Urine incontinence 07/31/2018   Urinary tract infection 07/31/2018    Allergies: No Known Allergies Medications:  Current Outpatient Medications:    ondansetron (ZOFRAN-ODT) 4 MG disintegrating tablet, Take 1 tablet (4 mg total) by mouth every 8 (eight) hours as needed for nausea or vomiting., Disp: 20 tablet, Rfl: 0   SUMAtriptan (IMITREX) 25 MG tablet, Take 1 tablet (25 mg total) by mouth every 2 (two) hours as needed for migraine. May repeat in 2 hours if headache persists or recurs., Disp: 10 tablet, Rfl: 0   pantoprazole (PROTONIX) 40 MG tablet, Take 1 tablet (40 mg total) by mouth daily., Disp: 30 tablet, Rfl: 0  Observations/Objective: Patient is well-developed, well-nourished in no acute distress.  Resting comfortably at home.  Head is normocephalic, atraumatic.  No labored breathing.  Speech is clear and coherent with logical content.  Patient is alert and oriented at baseline.    Assessment and Plan: 1. Migraine with aura and without status migrainosus, not intractable - SUMAtriptan (IMITREX) 25 MG tablet; Take 1 tablet (25 mg total) by mouth every 2 (two) hours as needed for migraine. May repeat in 2 hours if headache persists or recurs.  Dispense: 10 tablet; Refill: 0 - ondansetron (ZOFRAN-ODT) 4 MG disintegrating tablet; Take 1 tablet (4 mg total) by mouth every 8 (eight) hours as needed for nausea or vomiting.  Dispense: 20 tablet; Refill: 0  - Suspect migraines, but medication withdrawal and covid are also on differential - Will take covid test and f/u with results - Will add Imitrex and Zofran for possible migraine - Currently, no worrisome features. If headache changes and increases in severity, ir if develops confusion, double vision, severe nausea and vomiting, inability to walk straight, she needs to call 9-1-1 - Discussed f/u with PCP  in 1-2 weeks if headache improves with treatment  Follow Up Instructions: I discussed the assessment and treatment plan with the patient. The patient was provided an opportunity to ask questions and all were answered. The patient agreed with the plan and demonstrated an understanding of the instructions.  A copy of instructions were sent to the patient via MyChart unless otherwise noted below.   The patient was advised to call back or seek an in-person evaluation if the symptoms worsen or if the condition fails to improve as anticipated.  Time:  I spent 20 minutes with the patient via telehealth technology discussing the above problems/concerns.    Margaret Loveless, PA-C

## 2022-02-03 ENCOUNTER — Encounter: Payer: Self-pay | Admitting: Family

## 2022-02-03 ENCOUNTER — Telehealth: Payer: Self-pay | Admitting: Family

## 2022-02-03 ENCOUNTER — Ambulatory Visit (INDEPENDENT_AMBULATORY_CARE_PROVIDER_SITE_OTHER): Payer: BC Managed Care – PPO | Admitting: Family

## 2022-02-03 VITALS — BP 122/70 | HR 106 | Temp 99.4°F | Ht 67.0 in | Wt 177.8 lb

## 2022-02-03 DIAGNOSIS — Z8719 Personal history of other diseases of the digestive system: Secondary | ICD-10-CM

## 2022-02-03 DIAGNOSIS — G4719 Other hypersomnia: Secondary | ICD-10-CM

## 2022-02-03 DIAGNOSIS — R0683 Snoring: Secondary | ICD-10-CM

## 2022-02-03 MED ORDER — DOXYCYCLINE HYCLATE 100 MG PO TABS: 100.0000 mg | ORAL_TABLET | Freq: Two times a day (BID) | ORAL | 0 refills | Status: DC

## 2022-02-03 NOTE — Progress Notes (Signed)
Margaret Rich is a 41 y.o. female with the following history as recorded in EpicCare:  Patient Active Problem List   Diagnosis Date Noted   Carpal tunnel syndrome, right 01/18/2021   Biceps tendinitis of right upper extremity 01/18/2021   Anal fissure 09/24/2018   Anal pain 09/24/2018   Rectal bleeding 09/24/2018   Frequent headaches 07/31/2018   GERD (gastroesophageal reflux disease) 07/31/2018   High blood pressure 07/31/2018   Migraines 07/31/2018   Urine incontinence 07/31/2018   Urinary tract infection 07/31/2018    Current Outpatient Medications  Medication Sig Dispense Refill   doxycycline (VIBRA-TABS) 100 MG tablet Take 1 tablet (100 mg total) by mouth 2 (two) times daily. 14 tablet 0   Magnesium 400 MG TABS magnesium     Multiple Vitamins-Minerals (PRESERVISION AREDS 2 PO) Take by mouth.     vitamin C (ASCORBIC ACID) 500 MG tablet Vitamin C     No current facility-administered medications for this visit.    Allergies: Patient has no known allergies.  No past medical history on file.  No past surgical history on file.  Family History  Problem Relation Age of Onset   Alcohol abuse Father    Cancer Father    Alcohol abuse Brother    Cancer Maternal Grandmother    Diabetes Maternal Grandmother    High blood pressure Maternal Grandmother    Alcohol abuse Maternal Grandfather    Heart disease Maternal Grandfather    High Cholesterol Maternal Grandfather    High blood pressure Maternal Grandfather    Alcohol abuse Paternal Grandmother    Alcohol abuse Paternal Grandfather    Cancer Paternal Grandfather    Colon cancer Neg Hx    Esophageal cancer Neg Hx    Inflammatory bowel disease Neg Hx    Liver disease Neg Hx    Pancreatic cancer Neg Hx    Rectal cancer Neg Hx    Stomach cancer Neg Hx     Social History   Tobacco Use   Smoking status: Former    Years: 5.00    Types: Cigarettes    Quit date: 05/18/2008    Years since quitting: 13.7   Smokeless tobacco:  Never   Tobacco comments:    Social smoker;   Substance Use Topics   Alcohol use: Yes    Subjective:  Concerned about recurrent anal fistulas; did have flex sig in 11/2018 with no obvious abnormalities; concerned that chronic issue that may need surgical consult;  Also requesting referral for sleep study; loud snoring/ chronic daytime sleepiness;   58 yo daughter is with her for appointment today and becomes upset during course of visit- opted to do visit as quickly as possible to help daughter's needs;      Objective:  Vitals:   02/03/22 1450  BP: 122/70  Pulse: (!) 106  Temp: 99.4 F (37.4 C)  TempSrc: Oral  SpO2: 97%  Weight: 177 lb 12.8 oz (80.6 kg)  Height: 5\' 7"  (1.702 m)    General: Well developed, well nourished, in no acute distress  Skin : Warm and dry.  Head: Normocephalic and atraumatic  Lungs: Respirations unlabored;  Neurologic: Alert and oriented; speech intact; face symmetrical; moves all extremities well; CNII-XII intact without focal deficit   Assessment:  1. History of anal fissures   2. Snoring   3. Excessive daytime sleepiness     Plan:  Rx for Doxycycline 100 mg bid x 7 days; refer to colorectal surgeon for further evaluation; 2. &  3. Refer to neurology- referral updated;   This visit occurred during the SARS-CoV-2 public health emergency.  Safety protocols were in place, including screening questions prior to the visit, additional usage of staff PPE, and extensive cleaning of exam room while observing appropriate contact time as indicated for disinfecting solutions.    No follow-ups on file.  Orders Placed This Encounter  Procedures   Ambulatory referral to General Surgery    Referral Priority:   Routine    Referral Type:   Surgical    Referral Reason:   Specialty Services Required    Requested Specialty:   General Surgery    Number of Visits Requested:   1   Ambulatory referral to Neurology    Referral Priority:   Routine    Referral  Type:   Consultation    Referral Reason:   Specialty Services Required    Requested Specialty:   Neurology    Number of Visits Requested:   1    Requested Prescriptions   Signed Prescriptions Disp Refills   doxycycline (VIBRA-TABS) 100 MG tablet 14 tablet 0    Sig: Take 1 tablet (100 mg total) by mouth 2 (two) times daily.

## 2022-02-03 NOTE — Telephone Encounter (Signed)
Happy to see her today but want her to be aware that we have very limited options to manage an anal fistula here.  Has she reached out to her GI? They may be able to start her on something until they can see her in the office.

## 2022-02-03 NOTE — Telephone Encounter (Signed)
Noted  

## 2022-02-03 NOTE — Telephone Encounter (Signed)
Informed pt of notes below, she would like to keep the appt to discuss a new gi referral.

## 2022-02-03 NOTE — Telephone Encounter (Signed)
I have called the pt and left a message to call back.  °

## 2022-02-14 ENCOUNTER — Ambulatory Visit: Payer: Self-pay | Admitting: Surgery

## 2022-02-16 ENCOUNTER — Encounter: Payer: Self-pay | Admitting: Family

## 2022-04-11 ENCOUNTER — Ambulatory Visit: Payer: BC Managed Care – PPO | Admitting: Neurology

## 2022-04-11 ENCOUNTER — Encounter: Payer: Self-pay | Admitting: Neurology

## 2022-04-11 VITALS — BP 122/83 | HR 84 | Ht 68.0 in | Wt 178.0 lb

## 2022-04-11 DIAGNOSIS — G4719 Other hypersomnia: Secondary | ICD-10-CM | POA: Diagnosis not present

## 2022-04-11 DIAGNOSIS — R0683 Snoring: Secondary | ICD-10-CM | POA: Diagnosis not present

## 2022-04-11 DIAGNOSIS — R5382 Chronic fatigue, unspecified: Secondary | ICD-10-CM | POA: Diagnosis not present

## 2022-04-11 DIAGNOSIS — G478 Other sleep disorders: Secondary | ICD-10-CM

## 2022-04-11 DIAGNOSIS — F513 Sleepwalking [somnambulism]: Secondary | ICD-10-CM

## 2022-04-11 NOTE — Patient Instructions (Signed)

## 2022-04-11 NOTE — Progress Notes (Signed)
? ? ?SLEEP MEDICINE CLINIC ?  ? ?Provider:  Melvyn Novas, MD  ?Primary Care Physician:  Olive Bass, FNP ?[address] ?[place] [address]  ? ?  ?Referring Provider: Olive Bass, Fnp ?[address] ?[address] ?[place],  [place] [address]  ?  ?  ?    ?Chief Complaint according to patient   ?Patient presents with:  ?  ? New Patient (Initial Visit)  ?     ?  ?  ?HISTORY OF PRESENT ILLNESS:  ?Margaret Rich is a 41 y.o. Caucasian female patient seen here as a referral on 04/11/2022 from NP [facility] for a Sleep medicine consultation.  ? ?Chief concern according to patient :  patient with known snoring and sleepiness, fatigue, waking gasping for air. She also has an interesting condition of hypermobility and her son is even stronger affected.  ?Humorous , too" I am ready good at Yoga" - I can do full split. " ?  ?Nandini Bogdanski Klapper  has no past medical history on file. ?  ?Sleep relevant medical history:  Sleep walking: yes , absolutely -, Night terrors- no. ? ?  ? Family medical /sleep history: son had tonsillectomy at age 102. Son with ADHD, poor sleeper. Age 4.  ?no other family member on CPAP with OSA, insomnia, sleep walkers.  ?  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 ?Stay home mother of 2-  many pets, 2 dog, 1 cat and 1 turtle.  ?Tobacco use; for 6 years 1 cig a day-.  ? ETOH use: 1-2 month-,  ?Caffeine intake in form of Coffee( decaff/ ) Soda( less than one a week) Tea ( /) or energy drinks. ?Regular exercise in form of working out, yoga.  ? ?  ?Sleep habits are as follows: The patient's dinner time is between 5.30 PM.  Snack at 9 PM-The patient goes to bed at 11.30 PM and continues to sleep for intervals of 2-3 hours, total of 7 hours, wakes for one bathroom break, the first time at 5 AM.   ? ?The preferred sleep position is sideways, with the support of loads of  pillows. Flat bed.  ?Dreams are reportedly infrequent/not  vivid.  ? 6.30 AM is the usual rise time. The patient wakes up spontaneously. ?She reports not feeling refreshed or restored in AM, with symptoms such as dry mouth, rare morning headaches, and residual fatigue. Headaches more common after naps.  ?Naps are taken frequently, lasting from 10 to 15 minutes and are  refreshing than nocturnal sleep.  ?  ?Review of Systems: ?Out of a complete 14 system review, the patient complains of only the following symptoms, and all other reviewed systems are negative.:  ?Fatigue, sleepiness , snoring, fragmented sleep, light sleeper, daily desire to nap.  ?  ?How likely are you to doze in the following situations: ?0 = not likely, 1 = slight chance, 2 = moderate chance, 3 = high chance ?  ?Sitting and Reading? 3 ?Watching Television? 3 ?Sitting inactive in a public place (theater or meeting)?1-2 ?As a passenger in a car for an hour without a break?3 ?Lying down in the afternoon when circumstances permit?2 ?Sitting and talking to someone?0 ?Sitting quietly after lunch without alcohol?3 ?In a car, while stopped for a few minutes in traffic?0 ?  ?Total = 15-16/ 24 points  ? FSS endorsed at 52/ 63 points.  ? ?Social History  ? ?  Socioeconomic History  ? Marital status: Married  ?  Spouse name: Not on file  ? Number of children: 2  ? Years of education: Not on file  ? Highest education level: Not on file  ?Occupational History  ? Not on file  ?Tobacco Use  ? Smoking status: Former  ?  Years: 5.00  ?  Types: Cigarettes  ?  Quit date: 05/18/2008  ?  Years since quitting: 13.9  ? Smokeless tobacco: Never  ? Tobacco comments:  ?  Social smoker;   ?Vaping Use  ? Vaping Use: Never used  ?Substance and Sexual Activity  ? Alcohol use: Yes  ? Drug use: Never  ? Sexual activity: Not on file  ?Other Topics Concern  ? Not on file  ?Social History Narrative  ? Not on file  ? ?Social Determinants of Health  ? ?Financial Resource Strain: Not on file  ?Food Insecurity: Not on file  ?Transportation Needs:  Not on file  ?Physical Activity: Not on file  ?Stress: Not on file  ?Social Connections: Not on file  ? ? ?Family History  ?Problem Relation Age of Onset  ? Alcohol abuse Father   ? Cancer Father   ? Alcohol abuse Brother   ? Cancer Maternal Grandmother   ? Diabetes Maternal Grandmother   ? High blood pressure Maternal Grandmother   ? Alcohol abuse Maternal Grandfather   ? Heart disease Maternal Grandfather   ? High Cholesterol Maternal Grandfather   ? High blood pressure Maternal Grandfather   ? Alcohol abuse Paternal Grandmother   ? Alcohol abuse Paternal Grandfather   ? Cancer Paternal Grandfather   ? Colon cancer Neg Hx   ? Esophageal cancer Neg Hx   ? Inflammatory bowel disease Neg Hx   ? Liver disease Neg Hx   ? Pancreatic cancer Neg Hx   ? Rectal cancer Neg Hx   ? Stomach cancer Neg Hx   ? ? ?No past medical history on file. 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  ?Chronic fatigue.  ? ?No past surgical history on file.  ? ?Current Outpatient Medications on File Prior to Visit  ?Medication Sig Dispense Refill  ? Ascorbic Acid (VITAMIN C) 1000 MG tablet     ? buPROPion (WELLBUTRIN XL) 150 MG 24 hr tablet     ? Cranberry 300 MG tablet     ? Magnesium 400 MG CAPS Take by mouth.    ? Multiple Vitamins-Minerals (PRESERVISION AREDS 2 PO) Take by mouth.    ? ?No current facility-administered medications on file prior to visit.  ? ? ?Allergies  ?Allergen Reactions  ? Latex Dermatitis, Hives, Itching and Rash  ? ? ?Physical exam: ? ?Today's Vitals  ? 04/11/22 0844  ?BP: 122/83  ?Pulse: 84  ?Weight: 178 lb (80.7 kg)  ?Height: 5\' 8"  (1.727 m)  ? ?Body mass index is 27.06 kg/m?.  ? ?Wt Readings from Last 3 Encounters:  ?04/11/22 178 lb (80.7 kg)  ?02/03/22 177 lb 12.8 oz (80.6 kg)  ?06/28/21 189 lb 3.2 oz (85.8 kg)  ?  ? ?Ht Readings from Last 3 Encounters:  ?04/11/22 5\' 8"  (1.727 m)  ?02/03/22 5\' 7"  (1.702 m)  ?06/28/21 5\' 7"  (1.702 m)  ?  ?  ?General: The patient is awake, alert and appears not in acute distress. The patient  is well groomed. ?Head: Normocephalic, atraumatic. Neck is supple. Mallampati: 3,  ?neck circumference:14.75 inches . Nasal airflow is patent.  Retrognathia is not seen.  ?Dental status: biological ? ?  TMJ click.  ?Cardiovascular:  Regular rate and cardiac rhythm by pulse,  without distended neck veins. ?Respiratory: Lungs are clear to auscultation.  ?Skin:  Without evidence of ankle edema, or rash. ?Trunk: The patient's posture is erect. ?  ?Neurologic exam : ?The patient is awake and alert, oriented to place and time.   ?Memory subjective described as intact.  ?Attention span & concentration ability appears normal.  ?Speech is fluent,  without  dysarthria, dysphonia or aphasia.  ?Mood and affect are appropriate. ?  ?Cranial nerves: no loss of smell or taste reported  ?Pupils are equal and briskly reactive to light.  ?Funduscopic exam- wears contacts, no cataract.  ?Extraocular movements in vertical and horizontal planes were intact and without nystagmus. No Diplopia. ?Visual fields by finger perimetry are intact. ?Hearing was intact to soft voice and finger rubbing.   ? Facial sensation intact to fine touch. ? Facial motor strength is symmetric and tongue and uvula move midline.  ?Neck ROM : rotation, tilt and flexion extension were normal for age and shoulder shrug was symmetrical.  ?  ?Motor exam:  Symmetric bulk, tone and ROM.   ?Normal tone without cog-wheeling, symmetric grip strength . ?  ?Sensory:  Fine touch, pinprick and vibration were tested  and  normal.  ?Proprioception tested in the upper extremities was normal. ?  ?Coordination: Rapid alternating movements in the fingers/hands were of normal speed.  ?The Finger-to-nose maneuver was intact without evidence of ataxia, dysmetria or tremor. ?  ?Gait and station: Patient could rise unassisted from a seated position, walked without assistive device.  ?Stance is of normal width/ base and the patient turned with 3 steps.  ?Toe and heel walk were deferred.   ?Deep tendon reflexes: in the  upper and lower extremities are symmetric and intact.  ?Babinski response was deferred.  ?  ?  ?  ?After spending a total time of 45 minutes face to face and additional tim

## 2022-04-19 ENCOUNTER — Ambulatory Visit (INDEPENDENT_AMBULATORY_CARE_PROVIDER_SITE_OTHER): Payer: BC Managed Care – PPO | Admitting: Neurology

## 2022-04-19 DIAGNOSIS — R0683 Snoring: Secondary | ICD-10-CM

## 2022-04-19 DIAGNOSIS — G4719 Other hypersomnia: Secondary | ICD-10-CM

## 2022-04-19 DIAGNOSIS — G4733 Obstructive sleep apnea (adult) (pediatric): Secondary | ICD-10-CM | POA: Diagnosis not present

## 2022-04-19 DIAGNOSIS — G478 Other sleep disorders: Secondary | ICD-10-CM

## 2022-04-19 DIAGNOSIS — F513 Sleepwalking [somnambulism]: Secondary | ICD-10-CM

## 2022-04-19 DIAGNOSIS — R5382 Chronic fatigue, unspecified: Secondary | ICD-10-CM

## 2022-04-24 NOTE — Progress Notes (Signed)
? ? ?  ?  ?[facility] ?  ?HOME SLEEP TEST REPORT ( by Watch PAT)   ?STUDY DATE:  04-24-2022 ?  ?ORDERING CLINICIAN: Larey Seat, MD  ?REFERRING CLINICIAN: NP Valere Dross ?  ?CLINICAL INFORMATION/HISTORY: Margaret Rich is a 41 y.o. Caucasian female patient seen here as a referral on 04/11/2022 from NP [facility] for a Sleep medicine consultation.  ?Chief concern according to patient :  patient presents with known snoring and sleepiness, fatigue, waking up gasping for air. She also has an interesting finding of hypermobility and her son is even stronger affected. Had carpal tunnel.  ?Humorous too: " I am ready good at Yoga" - I can do full split. " ?  ?Darnell Level Eichholz  has sleep relevant medical history:  morning headaches, fatigue and sleepiness while having sufficient sleep time. Depression. Sleep walking history: yes , absolutely -Night terrors- no. Family medical /sleep history: son had tonsillectomy at age 101. Son with ADHD, poor sleeper. Age 80.  ?no other family member with OSA, insomnia, nor sleep walkers.  ? ? ?Epworth sleepiness score: 16/24. ?  ?BMI:27.1 kg/m? ?  ?Neck Circumference: 15" ?  ?FINDINGS: ?  ?Sleep Summary: ?  ?Total Recording Time (hours, min): For this home sleep test total recording time was 8 hours 37 minutes, total sleep time was 7 hours and 8 minutes                ?  ?Percent REM (%):     23.6/h                                 ?  ?Respiratory Indices: ?  ?Calculated pAHI (per hour): 10.9/h                         ?  ?REM pAHI: 18.7/h                                             ?  ?NREM pAHI:   8.7/h                         ?  ?Positional  AHI:   There were no positional or snoring data.                                              ?  ?Oxygen Saturation Statistics:         ?  ?O2 Saturation Range (%):   Between 90-99% , mean Saturation was 95%                                  ?  ?O2 Saturation (minutes) <89%:   0 minute      ?  ?Pulse Rate Statistics:       ?  ?Pulse Range:    55-122 bpm , mean heart rate was  82 bpm.             ?  ?IMPRESSION:  This HST confirms the presence of Mild Sleep apnea but, REM sleep dependent sleep  apnea. No positional or snoring data were collected.  No hypoxia was captured. Normal variable heart rate was noted. Sleep was not fragmented. ? ?  ?RECOMMENDATION: I can offer this patient a trial on CPAP - auto CPAP device with a pressure range of 5-13 cm water, 2 cm EPR and heated humidification, mask of patients choice.  ? ?Consider upping antidepressive mediation or therapy. ? ? ?  ?INTERPRETING PHYSICIAN: ? ? Larey Seat, MD  ? ?Medical Director of [facility].  ? ? ? ? ? ? ? ? ? ? ? ? ? ? ? ? ? ? ? ?

## 2022-04-27 ENCOUNTER — Telehealth: Payer: Self-pay | Admitting: Neurology

## 2022-04-27 DIAGNOSIS — R0683 Snoring: Secondary | ICD-10-CM | POA: Insufficient documentation

## 2022-04-27 DIAGNOSIS — G478 Other sleep disorders: Secondary | ICD-10-CM | POA: Insufficient documentation

## 2022-04-27 DIAGNOSIS — F513 Sleepwalking [somnambulism]: Secondary | ICD-10-CM | POA: Insufficient documentation

## 2022-04-27 NOTE — Telephone Encounter (Signed)
-----   Message from Melvyn Novas, MD sent at 04/27/2022  2:48 PM EDT ----- ?IMPRESSION:  This HST confirms the presence of Mild Sleep Apnea but REM sleep dependent sleep apnea.  ? No hypoxia was captured. Normal variable heart rate was noted. Sleep was not fragmented. ?No positional or snoring data were collected.  ?? ?? ?RECOMMENDATION: I can offer this patient a trial on CPAP - auto CPAP device with a pressure range of 5-13 cm water, 2 cm EPR and heated humidification, mask of patients choice. This would address REM sleep OSA and snoring and may help morning headaches as well. ?? ?Consider upping antidepressive mediation or therapy to help with fatigue. ?

## 2022-04-27 NOTE — Telephone Encounter (Signed)
I called pt. I advised pt that Dr. Vickey Huger  reviewed their sleep study results and found that pt has mild sleep apnea. Dr. Vickey Huger recommends that pt starts auto CPAP. I reviewed PAP compliance expectations with the pt. Pt is agreeable to starting a CPAP. I advised pt that an order will be sent to a DME, [facility], and [facility] will call the pt within about one week after they file with the pt's insurance. [facility] will show the pt how to use the machine, fit for masks, and troubleshoot the CPAP if needed. A follow up appt was made for insurance purposes with Dr. Vickey Huger on 07/04/2022 at 3:30 pm. Pt verbalized understanding to arrive 15 minutes early and bring their CPAP. A letter with all of this information in it will be mailed to the pt as a reminder. I verified with the pt that the address we have on file is correct. Pt verbalized understanding of results. Pt had no questions at this time but was encouraged to call back if questions arise. I have sent the order to [facility] and have received confirmation that they have received the order. ? ?

## 2022-04-27 NOTE — Addendum Note (Signed)
Addended by: Larey Seat on: 04/27/2022 02:48 PM ? ? Modules accepted: Orders ? ?

## 2022-04-27 NOTE — Procedures (Signed)
?  ?  ?[facility] ?  ?HOME SLEEP TEST REPORT ( by Watch PAT)   ?STUDY DATE:  04-24-2022 ?  ?ORDERING CLINICIAN: Melvyn Novas, MD  ?REFERRING CLINICIAN: NP Dayton Scrape ?  ?CLINICAL INFORMATION/HISTORY: Margaret Rich is a 41 y.o. Caucasian female patient seen here as a referral on 04/11/2022 from NP [facility] for a Sleep medicine consultation.  ?Chief concern according to patient :  patient presents with known snoring and sleepiness, fatigue, waking up gasping for air. She also has an interesting finding of hypermobility and her son is even stronger affected. Had carpal tunnel.  ?Humorous too: " I am ready good at Yoga" - I can do full split. " ?  ?[name]  has sleep relevant medical history:  morning headaches, fatigue and sleepiness while having sufficient sleep time. Depression. Sleep walking history: yes , absolutely -Night terrors- no. Family medical /sleep history: son had tonsillectomy at age 43. Son with ADHD, poor sleeper. Age 24.  ?no other family member with OSA, insomnia, nor sleep walkers.  ? ? ?Epworth sleepiness score: 16/24. ?  ?BMI:27.1 kg/m? ?  ?Neck Circumference: 15" ?  ?FINDINGS: ?  ?Sleep Summary: ?  ?Total Recording Time (hours, min): For this home sleep test total recording time was 8 hours 37 minutes, total sleep time was 7 hours and 8 minutes                ?  ?Percent REM (%):     23.6/h                                 ?  ?Respiratory Indices: ?  ?Calculated pAHI (per hour): 10.9/h                         ?  ?REM pAHI: 18.7/h                                             ?  ?NREM pAHI:   8.7/h                         ?  ?Positional  AHI:   There were no positional or snoring data.                                              ?  ?Oxygen Saturation Statistics:         ?  ?O2 Saturation Range (%):   Between 90-99% , mean Saturation was 95%                                  ?  ?O2 Saturation (minutes) <89%:   0 minute      ?  ?Pulse Rate Statistics:       ?  ?Pulse Range:   55-122  bpm , mean heart rate was  82 bpm.             ?  ?IMPRESSION:  This HST confirms the presence of Mild Sleep apnea but, REM sleep dependent sleep apnea. No  positional or snoring data were collected.  No hypoxia was captured. Normal variable heart rate was noted. Sleep was not fragmented. ? ?  ?RECOMMENDATION: I can offer this patient a trial on CPAP - auto CPAP device with a pressure range of 5-13 cm water, 2 cm EPR and heated humidification, mask of patients choice.  ? ?Consider upping antidepressive mediation or therapy. ? ? ?  ?INTERPRETING PHYSICIAN: ? ? Melvyn Novas, MD  ? ?Medical Director of [facility] at [facility].  ? ? ? ? ? ? ? ? ? ? ? ? ? ? ? ? ?POSITIONAL AND SNORING CHANNEL FAILED.  ? ? ?

## 2022-05-02 ENCOUNTER — Encounter: Payer: Self-pay | Admitting: Family

## 2022-05-03 ENCOUNTER — Other Ambulatory Visit: Payer: Self-pay | Admitting: Family

## 2022-05-03 DIAGNOSIS — G43109 Migraine with aura, not intractable, without status migrainosus: Secondary | ICD-10-CM

## 2022-05-03 MED ORDER — SUMATRIPTAN SUCCINATE 50 MG PO TABS: 50.0000 mg | ORAL_TABLET | ORAL | 0 refills | Status: DC | PRN

## 2022-05-26 ENCOUNTER — Telehealth: Payer: Self-pay | Admitting: Family

## 2022-05-26 NOTE — Telephone Encounter (Signed)
Patient states she has a migraine today that started around noon and the Imitrex has not helped at all, she stated she also took ibuprofen and still has not helped. She would like advise on what to do next?

## 2022-05-26 NOTE — Telephone Encounter (Signed)
Called pt and was advised to come in office for shot are go  To [facility] if needs to.

## 2022-06-06 DIAGNOSIS — R87810 Cervical high risk human papillomavirus (HPV) DNA test positive: Secondary | ICD-10-CM | POA: Insufficient documentation

## 2022-07-04 ENCOUNTER — Ambulatory Visit: Payer: BC Managed Care – PPO | Admitting: Neurology

## 2022-07-11 ENCOUNTER — Ambulatory Visit (INDEPENDENT_AMBULATORY_CARE_PROVIDER_SITE_OTHER): Payer: BC Managed Care – PPO | Admitting: Family

## 2022-07-11 VITALS — BP 118/82 | HR 56 | Temp 99.2°F | Resp 16 | Ht 68.0 in | Wt 185.0 lb

## 2022-07-11 DIAGNOSIS — L309 Dermatitis, unspecified: Secondary | ICD-10-CM | POA: Diagnosis not present

## 2022-07-11 DIAGNOSIS — R0781 Pleurodynia: Secondary | ICD-10-CM

## 2022-07-11 DIAGNOSIS — G43809 Other migraine, not intractable, without status migrainosus: Secondary | ICD-10-CM

## 2022-07-11 MED ORDER — METHYLPREDNISOLONE ACETATE 80 MG/ML IJ SUSP
40.0000 mg | Freq: Once | INTRAMUSCULAR | Status: AC
  Administered 2022-07-11: 40 mg via INTRAMUSCULAR

## 2022-07-11 MED ORDER — BETAMETHASONE DIPROPIONATE AUG 0.05 % EX CREA: TOPICAL_CREAM | Freq: Two times a day (BID) | CUTANEOUS | 0 refills | Status: DC

## 2022-07-11 MED ORDER — SUMATRIPTAN SUCCINATE 25 MG PO TABS: 25.0000 mg | ORAL_TABLET | ORAL | 1 refills | Status: DC | PRN

## 2022-07-11 MED ORDER — DOXYCYCLINE HYCLATE 100 MG PO TABS: 100.0000 mg | ORAL_TABLET | Freq: Two times a day (BID) | ORAL | 0 refills | Status: DC

## 2022-07-11 MED ORDER — METHYLPREDNISOLONE ACETATE 40 MG/ML IJ SUSP: 40.0000 mg | Freq: Once | INTRAMUSCULAR | Status: DC

## 2022-07-11 NOTE — Progress Notes (Signed)
Margaret Rich is a 41 y.o. female with the following history as recorded in [employer]:  Patient Active Problem List   Diagnosis Date Noted   Non-restorative sleep 04/27/2022   Snoring 04/27/2022   Sleep walking 04/27/2022   Excessive daytime sleepiness 04/11/2022   Chronic fatigue 04/11/2022   Carpal tunnel syndrome, right 01/18/2021   Biceps tendinitis of right upper extremity 01/18/2021   Anal fissure 09/24/2018   Anal pain 09/24/2018   Rectal bleeding 09/24/2018   Frequent headaches 07/31/2018   GERD (gastroesophageal reflux disease) 07/31/2018   High blood pressure 07/31/2018   Migraines 07/31/2018   Urine incontinence 07/31/2018   Urinary tract infection 07/31/2018    Current Outpatient Medications  Medication Sig Dispense Refill   Ascorbic Acid (VITAMIN C) 1000 MG tablet      augmented betamethasone dipropionate (DIPROLENE AF) 0.05 % cream Apply topically 2 (two) times daily. 30 g 0   buPROPion (WELLBUTRIN XL) 150 MG 24 hr tablet      Cranberry 300 MG tablet      doxycycline (VIBRA-TABS) 100 MG tablet Take 1 tablet (100 mg total) by mouth 2 (two) times daily. 14 tablet 0   Magnesium 400 MG CAPS Take by mouth.     Multiple Vitamins-Minerals (PRESERVISION AREDS 2 PO) Take by mouth.     SUMAtriptan (IMITREX) 25 MG tablet Take 1 tablet (25 mg total) by mouth every 2 (two) hours as needed for migraine. May repeat in 2 hours if headache persists or recurs. 10 tablet 1   Current Facility-Administered Medications  Medication Dose Route Frequency Provider Last Rate Last Admin   methylPREDNISolone acetate (DEPO-MEDROL) injection 40 mg  40 mg Intramuscular Once Olive Bass, FNP        Allergies: Latex  No past medical history on file.  No past surgical history on file.  Family History  Problem Relation Age of Onset   Alcohol abuse Father    Cancer Father    Alcohol abuse Brother    Cancer Maternal Grandmother    Diabetes Maternal Grandmother    High blood  pressure Maternal Grandmother    Alcohol abuse Maternal Grandfather    Heart disease Maternal Grandfather    High Cholesterol Maternal Grandfather    High blood pressure Maternal Grandfather    Alcohol abuse Paternal Grandmother    Alcohol abuse Paternal Grandfather    Cancer Paternal Grandfather    Colon cancer Neg Hx    Esophageal cancer Neg Hx    Inflammatory bowel disease Neg Hx    Liver disease Neg Hx    Pancreatic cancer Neg Hx    Rectal cancer Neg Hx    Stomach cancer Neg Hx     Social History   Tobacco Use   Smoking status: Former    Years: 5.00    Types: Cigarettes    Quit date: 05/18/2008    Years since quitting: 14.1   Smokeless tobacco: Never   Tobacco comments:    Social smoker;   Substance Use Topics   Alcohol use: Yes    Subjective:    Concerned for poison ivy exposure approximately 1 week ago; outbreak has mostly been limited to extremities; one area of concern on left upper thigh- initially looked dark spot and now red ring present;  Also having left sided rib pain when she first gets up in the am; limited relief with chiropractor;     Objective:  Vitals:   07/11/22 1546  BP: 118/82  Pulse: (!) 56  Resp: 16  Temp: 99.2 F (37.3 C)  TempSrc: Oral  SpO2: 98%  Weight: 185 lb (83.9 kg)  Height: 5\' 8"  (1.727 m)    General: Well developed, well nourished, in no acute distress  Skin : Warm and dry. Localized area of erythema on left thigh Head: Normocephalic and atraumatic  Eyes: Sclera and conjunctiva clear; pupils round and reactive to light; extraocular movements intact  Ears: External normal; canals clear; tympanic membranes normal  Oropharynx: Pink, supple. No suspicious lesions  Neck: Supple without thyromegaly, adenopathy  Lungs: Respirations unlabored;  Neurologic: Alert and oriented; speech intact; face symmetrical; moves all extremities well; CNII-XII intact without focal deficit   Assessment:  1. Dermatitis   2. Rib pain on left side    3. Other migraine without status migrainosus, not intractable     Plan:  Depo-Medrol IM 40 mg given; Rx for Doxycycline and Diprolene cream; follow up worse, no better. Refer to sports medicine for possible adjustment;  Prefers lower dosage of Imitrex- refill updated;   No follow-ups on file.  Orders Placed This Encounter  Procedures   Ambulatory referral to Sports Medicine    Referral Priority:   Routine    Referral Type:   Consultation    Referred to Provider:   , DO    Number of Visits Requested:   1    Requested Prescriptions   Signed Prescriptions Disp Refills   SUMAtriptan (IMITREX) 25 MG tablet 10 tablet 1    Sig: Take 1 tablet (25 mg total) by mouth every 2 (two) hours as needed for migraine. May repeat in 2 hours if headache persists or recurs.   doxycycline (VIBRA-TABS) 100 MG tablet 14 tablet 0    Sig: Take 1 tablet (100 mg total) by mouth 2 (two) times daily.   augmented betamethasone dipropionate (DIPROLENE AF) 0.05 % cream 30 g 0    Sig: Apply topically 2 (two) times daily.

## 2022-07-12 ENCOUNTER — Ambulatory Visit: Payer: BC Managed Care – PPO | Admitting: Neurology

## 2022-07-12 ENCOUNTER — Encounter: Payer: Self-pay | Admitting: Neurology

## 2022-07-12 VITALS — BP 126/80 | HR 87 | Ht 68.0 in | Wt 183.0 lb

## 2022-07-12 DIAGNOSIS — M2480 Other specific joint derangements of unspecified joint, not elsewhere classified: Secondary | ICD-10-CM | POA: Diagnosis not present

## 2022-07-12 DIAGNOSIS — R5382 Chronic fatigue, unspecified: Secondary | ICD-10-CM

## 2022-07-12 DIAGNOSIS — Q796 Ehlers-Danlos syndrome, unspecified: Secondary | ICD-10-CM | POA: Diagnosis not present

## 2022-07-12 DIAGNOSIS — K3184 Gastroparesis: Secondary | ICD-10-CM | POA: Diagnosis not present

## 2022-07-12 NOTE — Progress Notes (Signed)
SLEEP MEDICINE CLINIC    Provider:  Melvyn Novas, MD  Primary Care Physician:  Olive Bass, FNP [address] [place] [address]     Referring Provider: Olive Bass, Fnp [address] [place],  [place] [address]          Chief Complaint according to patient   Patient presents with:     New Patient (Initial Visit)           HISTORY OF PRESENT ILLNESS:    07-12-2022:  Margaret Rich is a 41 y.o. Caucasian female patient and seen in a Rv on 07/12/2022 , Here with new CPAP.  Marland Kitchen  My patient is here today she has some looking well but states she still endorsed the fatigue severity scale today at 45 points which is elevated her Epworth sleepiness scale is today endorsed at 13 points which is significantly less than last time.  She underwent a home sleep test on 5-8 2023 which showed an overall mild apnea hypopnea index of 10.9/h which was however double as high during REM sleep in comparison to non-REM REM sleep.  Positional data were not part of the report.    I offered her a auto CPAP device to give it a try and our goal is to see if she has responded with less sleepiness.  I attributed to her fatigue that she had also endorsed before partially to depression.    07-12-2022: today's compliance visit shows excellent data and she has used the machine 29 out of 30 days 75% over 4 hours with an average use at time of just 5 hours 2 minutes.  So she could work on 1/2-hour more a day and that would increase her compliance significantly she is using an AutoSet AirSense 10 between 5 and 13 cm water pressure window with 3 cm expiratory relief function her residual AHI is 0.6/h which is a significant reduction.  There are no central apneas emerging.  The 95th percentile pressure is 7.6 cm water and the 95 percentile air leak is 12.4 L/min.  So her mask seems to fit well and I would like her to continue under the current settings if  possible.  Currently not a snorer on CPAP, no allergies, no GERD.        04-11-2022 Seen originally from NP [facility] for a Sleep medicine consultation.   Chief concern according to patient :  patient with known snoring and sleepiness, fatigue, waking gasping for air. She also has an interesting condition of hypermobility and her son is even stronger affected.  Humorous , too" I am ready good at Yoga" - I can do full split. "   Margaret Rich  has no past medical history on file.   Sleep relevant medical history:  Sleep walking: yes , absolutely -, Night terrors- no.     Family medical /sleep history: son had tonsillectomy at age 52. Son with ADHD, poor sleeper. Age 94.  no other family member on CPAP with OSA, insomnia, sleep walkers.    Social history:  Patient is working as Actuary of a hobby farm. [place] Stay home mother of 2-  many pets, 2 dog, 1 cat and 1 turtle.  Tobacco use; for 6 years 1 cig a day-.   ETOH use: 1-2 month-,  Caffeine intake in form of Coffee( decaff/ ) Soda( less than one a week) Tea ( /) or energy drinks. Regular  exercise in form of working out, yoga.     Sleep habits are as follows: The patient's dinner time is between 5.30 PM.  Snack at 9 PM-The patient goes to bed at 11.30 PM and continues to sleep for intervals of 2-3 hours, total of 7 hours, wakes for one bathroom break, the first time at 5 AM.    The preferred sleep position is sideways, with the support of loads of  pillows. Flat bed.  Dreams are reportedly infrequent/not vivid.   6.30 AM is the usual rise time. The patient wakes up spontaneously. She reports not feeling refreshed or restored in AM, with symptoms such as dry mouth, rare morning headaches, and residual fatigue. Headaches more common after naps.  Naps are taken frequently, lasting from 10 to 15 minutes and are  refreshing than nocturnal sleep.    Review of Systems: Out of a complete 14 system review, the patient complains  of only the following symptoms, and all other reviewed systems are negative.:  Fatigue, sleepiness , snoring, fragmented sleep, light sleeper, daily desire to nap.    How likely are you to doze in the following situations: 0 = not likely, 1 = slight chance, 2 = moderate chance, 3 = high chance   Sitting and Reading? 3 Watching Television? 3 Sitting inactive in a public place (theater or meeting)?1-2 As a passenger in a car for an hour without a break?3 Lying down in the afternoon when circumstances permit?2 Sitting and talking to someone?0 Sitting quietly after lunch without alcohol?3 In a car, while stopped for a few minutes in traffic?0   Total = 13 from 16/ 24 points pre CPAP.   FSS endorsed at  45 from 52/ 63 points.    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 Son's pediatrician suggested testing for him.   Social History   Socioeconomic History   Marital status: Married    Spouse name: Not on file   Number of children: 2   Years of education: Not on file   Highest education level: Not on file  Occupational History   Not on file  Tobacco Use   Smoking status: Former    Years: 5.00    Types: Cigarettes    Quit date: 05/18/2008    Years since quitting: 14.1   Smokeless tobacco: Never   Tobacco comments:    Social smoker;   Building services engineer Use: Never used  Substance and Sexual Activity   Alcohol use: Yes   Drug use: Never   Sexual activity: Not on file  Other Topics Concern   Not on file  Social History Narrative   Not on file   Social Determinants of Health   Financial Resource Strain: Not on file  Food Insecurity: Not on file  Transportation Needs: Not on file  Physical Activity: Not on file  Stress: Not on file  Social Connections: Not on file    Family History  Problem Relation Age of Onset   Alcohol abuse Father    Cancer Father    Alcohol abuse Brother    Cancer Maternal Grandmother    Diabetes Maternal Grandmother    High blood pressure  Maternal Grandmother    Alcohol abuse Maternal Grandfather    Heart disease Maternal Grandfather    High Cholesterol Maternal Grandfather    High blood pressure Maternal Grandfather    Alcohol abuse Paternal Grandmother    Alcohol abuse Paternal Grandfather    Cancer Paternal Grandfather    Colon  cancer Neg Hx    Esophageal cancer Neg Hx    Inflammatory bowel disease Neg Hx    Liver disease Neg Hx    Pancreatic cancer Neg Hx    Rectal cancer Neg Hx    Stomach cancer Neg Hx     No past medical history on file. 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  Chronic fatigue.   No past surgical history on file.   Current Outpatient Medications on File Prior to Visit  Medication Sig Dispense Refill   Ascorbic Acid (VITAMIN C) 1000 MG tablet      augmented betamethasone dipropionate (DIPROLENE AF) 0.05 % cream Apply topically 2 (two) times daily. 30 g 0   buPROPion (WELLBUTRIN XL) 150 MG 24 hr tablet      doxycycline (VIBRA-TABS) 100 MG tablet Take 1 tablet (100 mg total) by mouth 2 (two) times daily. 14 tablet 0   OVER THE COUNTER MEDICATION Take 1 tablet by mouth 2 (two) times daily. Migrelief + M     SUMAtriptan (IMITREX) 25 MG tablet Take 1 tablet (25 mg total) by mouth every 2 (two) hours as needed for migraine. May repeat in 2 hours if headache persists or recurs. 10 tablet 1   Current Facility-Administered Medications on File Prior to Visit  Medication Dose Route Frequency Provider Last Rate Last Admin   methylPREDNISolone acetate (DEPO-MEDROL) injection 40 mg  40 mg Intramuscular Once Olive Bass, FNP        Allergies  Allergen Reactions   Latex Dermatitis, Hives, Itching and Rash    Physical exam:  Today's Vitals   07/12/22 1035  BP: 126/80  Pulse: 87  Weight: 183 lb (83 kg)  Height: 5\' 8"  (1.727 m)   Body mass index is 27.83 kg/m.   Wt Readings from Last 3 Encounters:  07/12/22 183 lb (83 kg)  07/11/22 185 lb (83.9 kg)  04/11/22 178 lb (80.7 kg)     Ht  Readings from Last 3 Encounters:  07/12/22 5\' 8"  (1.727 m)  07/11/22 5\' 8"  (1.727 m)  04/11/22 5\' 8"  (1.727 m)      General: The patient is awake, alert and appears not in acute distress. The patient is well groomed. Head: Normocephalic, atraumatic. Neck is supple. Mallampati: 3,  neck circumference:14.75 inches . Nasal airflow is patent.  Retrognathia is not seen.  Dental status: biological  TMJ click.  Cardiovascular:  Regular rate and cardiac rhythm by pulse,  without distended neck veins. Respiratory: Lungs are clear to auscultation.  Skin:  Without evidence of ankle edema, or rash. Trunk: The patient's posture is erect.   Neurologic exam : The patient is awake and alert, oriented to place and time.   Memory subjective described as intact.  Attention span & concentration ability appears normal.  Speech is fluent,  without  dysarthria, dysphonia or aphasia.  Mood and affect are appropriate.   Cranial nerves: no loss of smell or taste reported  Pupils are equal and briskly reactive to light.  Funduscopic exam- wears contacts, no cataract.  Extraocular movements in vertical and horizontal planes were intact and without nystagmus. No Diplopia. Visual fields by finger perimetry are intact. Hearing was intact to soft voice and finger rubbing.    Facial sensation intact to fine touch.  Facial motor strength is symmetric and tongue and uvula move midline.  Neck ROM : rotation, tilt and flexion extension were normal for age and shoulder shrug was symmetrical.    Motor exam:  Symmetric bulk, tone and  ROM.   Normal tone without cog-wheeling, symmetric grip strength .   Sensory:  Fine touch, pinprick and vibration were tested  and  normal.  Proprioception tested in the upper extremities was normal.   Coordination: Rapid alternating movements in the fingers/hands were of normal speed.  The Finger-to-nose maneuver was intact without evidence of ataxia, dysmetria or tremor.   Gait and  station: Patient could rise unassisted from a seated position, walked without assistive device.  Stance is of normal width/ base and the patient turned with 3 steps.  Toe and heel walk were deferred.  Deep tendon reflexes: in the  upper and lower extremities are symmetric and intact.  Babinski response was deferred.        After spending a total time of 45 minutes face to face and additional time for physical and neurologic examination, review of laboratory studies,  personal review of imaging studies, reports and results of other testing and review of referral information / records as far as provided in visit, I have established the following assessments:  1) OSA , mild but REM dependent : Very high degree of fatigue has been responding to CPAP  and excessive daytime sleepiness has improved.Marland Kitchen   Resolution of Morning headaches, of Snoring . 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    My Plan is to proceed with:  Continue CPAP at current settings.   Genetic testing recommended. Hyperflexibility, with dislocated shoulder, knees, hands. No enlarged aortic aneurysm known, no heart valve disease known,  orthostatic hypotension since childhood.    I would like to thank Olive Bass, FNP and Olive Bass, Fnp [address] [place],  [place] [address] for allowing me to meet with and to take care of this pleasant patient.   I plan to follow up either personally or through our NP within 12 months.   CC: I will share my notes with PCP.  Electronically signed by: Melvyn Novas, MD 07/12/2022 10:43 AM  [facility] Board certified by The [employer] of Sleep Medicine and Diplomate of the [employer] of Sleep Medicine. Board certified In Neurology through the ABPN, Fellow of the [employer] of Neu[employer]. Medical Director of [facility].

## 2022-07-18 ENCOUNTER — Other Ambulatory Visit: Payer: Self-pay | Admitting: Family

## 2022-07-18 ENCOUNTER — Encounter: Payer: Self-pay | Admitting: Family

## 2022-07-18 MED ORDER — BUPROPION HCL ER (XL) 150 MG PO TB24
150.0000 mg | ORAL_TABLET | Freq: Every day | ORAL | 2 refills | Status: DC
Start: 2022-07-18 — End: 2023-07-18

## 2022-07-26 ENCOUNTER — Encounter: Payer: Self-pay | Admitting: Neurology

## 2022-08-01 NOTE — Progress Notes (Unsigned)
[facility] [address] [place] [address] Phone: [phone] Subjective:   Margaret Rich, am serving as a scribe for Dr. Antoine Primas.  I'm seeing this patient by the request  of:  Olive Bass, FNP  CC: rib and hip pain follow up   [id number]  Margaret Rich is a 41 y.o. female coming in with complaint of left side rib and hip pain. Has seen Dr. Denyse Amass in the past for carpal tunnel and hypermobility.  Patient is also being followed by neurology.  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 Pain seems to be getting worse in L lumbar spine, L GT and in the L ribs. Wakes up with no pain. Pain starts to hurt with standing and back will lock up. Throughout the day her pain is much less as she moves around. Uses IBU and does yoga once a week.   Patient also notes jamming R MTP last week on a diving board.       No past medical history on file. No past surgical history on file. Social History   Socioeconomic History   Marital status: Married    Spouse name: Not on file   Number of children: 2   Years of education: Not on file   Highest education level: Not on file  Occupational History   Not on file  Tobacco Use   Smoking status: Former    Years: 5.00    Types: Cigarettes    Quit date: 05/18/2008    Years since quitting: 14.2   Smokeless tobacco: Never   Tobacco comments:    Social smoker;   Building services engineer Use: Never used  Substance and Sexual Activity   Alcohol use: Yes   Drug use: Never   Sexual activity: Not on file  Other Topics Concern   Not on file  Social History Narrative   Not on file   Social Determinants of Health   Financial Resource Strain: Not on file  Food Insecurity: Not on file  Transportation Needs: Not on file  Physical Activity: Not on file  Stress: Not on file  Social Connections: Not on file   Allergies  Allergen Reactions   Latex Dermatitis, Hives, Itching and Rash    Family History  Problem Relation Age of Onset   Alcohol abuse Father    Cancer Father    Alcohol abuse Brother    Cancer Maternal Grandmother    Diabetes Maternal Grandmother    High blood pressure Maternal Grandmother    Alcohol abuse Maternal Grandfather    Heart disease Maternal Grandfather    High Cholesterol Maternal Grandfather    High blood pressure Maternal Grandfather    Alcohol abuse Paternal Grandmother    Alcohol abuse Paternal Grandfather    Cancer Paternal Grandfather    Colon cancer Neg Hx    Esophageal cancer Neg Hx    Inflammatory bowel disease Neg Hx    Liver disease Neg Hx    Pancreatic cancer Neg Hx    Rectal cancer Neg Hx    Stomach cancer Neg Hx        Current Outpatient Medications (Analgesics):    SUMAtriptan (IMITREX) 25 MG tablet, Take 1 tablet (25 mg total) by mouth every 2 (two) hours as needed for migraine. May repeat in 2 hours if headache persists or recurs.   Current Outpatient Medications (Other):    Ascorbic Acid (VITAMIN C) 1000 MG tablet,  buPROPion (WELLBUTRIN XL) 150 MG 24 hr tablet, Take 1 tablet (150 mg total) by mouth daily.   OVER THE COUNTER MEDICATION, Take 1 tablet by mouth 2 (two) times daily. Migrelief + M   augmented betamethasone dipropionate (DIPROLENE AF) 0.05 % cream, Apply topically 2 (two) times daily.   doxycycline (VIBRA-TABS) 100 MG tablet, Take 1 tablet (100 mg total) by mouth 2 (two) times daily.   Reviewed prior external information including notes and imaging from  primary care provider As well as notes that were available from care everywhere and other healthcare systems.  Past medical history, social, surgical and family history all reviewed in electronic medical record.  No pertanent information unless stated regarding to the chief complaint.   Review of Systems:  No headache, visual changes, nausea, vomiting, diarrhea, constipation, dizziness, abdominal pain, skin rash, fevers, chills, night sweats,  weight loss, swollen lymph nodes, body aches, joint swelling, chest pain, shortness of breath, mood changes. POSITIVE muscle aches  Objective  Blood pressure 102/72, pulse 95, height 5\' 8"  (1.727 m), weight 188 lb (85.3 kg), SpO2 97 %.   General: No apparent distress alert and oriented x3 mood and affect normal, dressed appropriately.  HEENT: Pupils equal, extraocular movements intact  Respiratory: Patient's speak in full sentences and does not appear short of breath  Cardiovascular: No lower extremity edema, non tender, no erythema  Hypermobility noted.  Patient does have some tenderness to palpation in the paraspinal musculature of the lumbar spine.  Very mild loss of lordosis.  Patient actually does have significant hypermobility with a Beighton score of 8   Osteopathic findings C4 flexed rotated and side bent left T5 extended rotated and side bent right inhaled third rib T6 extended rotated and side bent left inhaled rib  L1 flexed rotated and side bent right Sacrum right on right   [id number]; 15 additional minutes spent for Therapeutic exercises as stated in above notes.  This included exercises focusing on stretching, strengthening, with significant focus on eccentric aspects.   Long term goals include an improvement in range of motion, strength, endurance as well as avoiding reinjury. Patient's frequency would include in 1-2 times a day, 3-5 times a week for a duration of 6-12 weeks. Low back exercises that included:  Pelvic tilt/bracing instruction to focus on control of the pelvic girdle and lower abdominal muscles  Glute strengthening exercises, focusing on proper firing of the glutes without engaging the low back muscles Proper stretching techniques for maximum relief for the hamstrings, hip flexors, low back and some rotation where tolerated  Proper technique shown and discussed handout in great detail with ATC.  All questions were discussed and answered.      Impression and  Recommendations:     The above documentation has been reviewed and is accurate and complete , DO

## 2022-08-02 ENCOUNTER — Ambulatory Visit: Payer: BC Managed Care – PPO | Admitting: Family Medicine

## 2022-08-02 VITALS — BP 102/72 | HR 95 | Ht 68.0 in | Wt 188.0 lb

## 2022-08-02 DIAGNOSIS — M2480 Other specific joint derangements of unspecified joint, not elsewhere classified: Secondary | ICD-10-CM

## 2022-08-02 DIAGNOSIS — M9901 Segmental and somatic dysfunction of cervical region: Secondary | ICD-10-CM | POA: Diagnosis not present

## 2022-08-02 DIAGNOSIS — M9904 Segmental and somatic dysfunction of sacral region: Secondary | ICD-10-CM | POA: Diagnosis not present

## 2022-08-02 DIAGNOSIS — M9902 Segmental and somatic dysfunction of thoracic region: Secondary | ICD-10-CM

## 2022-08-02 DIAGNOSIS — M9908 Segmental and somatic dysfunction of rib cage: Secondary | ICD-10-CM | POA: Diagnosis not present

## 2022-08-02 DIAGNOSIS — M9903 Segmental and somatic dysfunction of lumbar region: Secondary | ICD-10-CM

## 2022-08-02 NOTE — Patient Instructions (Signed)
Vit C 500mg  daily Vit D 2000IU daily Exercises Spenco Total Support Orthotics HOKA recovery sandals in the house Tart cherry extract 1200mg   See me in 6 weeks

## 2022-08-03 DIAGNOSIS — M9904 Segmental and somatic dysfunction of sacral region: Secondary | ICD-10-CM | POA: Insufficient documentation

## 2022-08-03 NOTE — Assessment & Plan Note (Signed)

## 2022-08-03 NOTE — Assessment & Plan Note (Signed)
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  Still waiting genetic testing patient does have some tightness.  Has been doing relatively well though over the course of time.  No significant dislocations in the history.  Patient should do relatively well with conservative therapy including vitamin supplementations, core strengthening, hip abductor strengthening.  Patient given exercises today.  Responded well to osteopathic manipulation.  Follow-up again in 6 to 8 weeks

## 2022-08-07 LAB — GENESEQ: FAMILIAL AORTOPATHY

## 2022-08-07 NOTE — Progress Notes (Signed)
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

## 2022-08-07 NOTE — Progress Notes (Signed)
Negative.

## 2022-09-03 IMAGING — US US ABDOMEN COMPLETE
1 series · 14 of 25 positions shown · non-contrast
Comparison: None.

CLINICAL DATA: Abdominal pain for 1 month

EXAM:
ABDOMEN ULTRASOUND COMPLETE

[Series 1: us abdomen complete · 0.26mm/px · 14 of 82 slices shown]
[im 1/82]
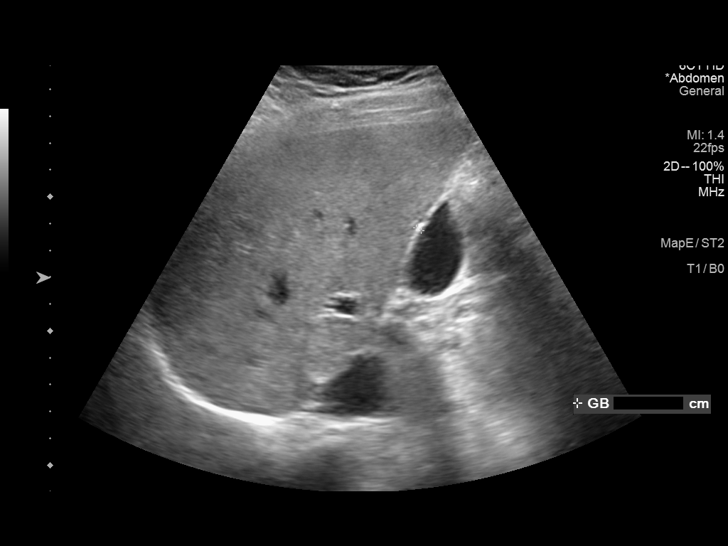
[im 7/82]
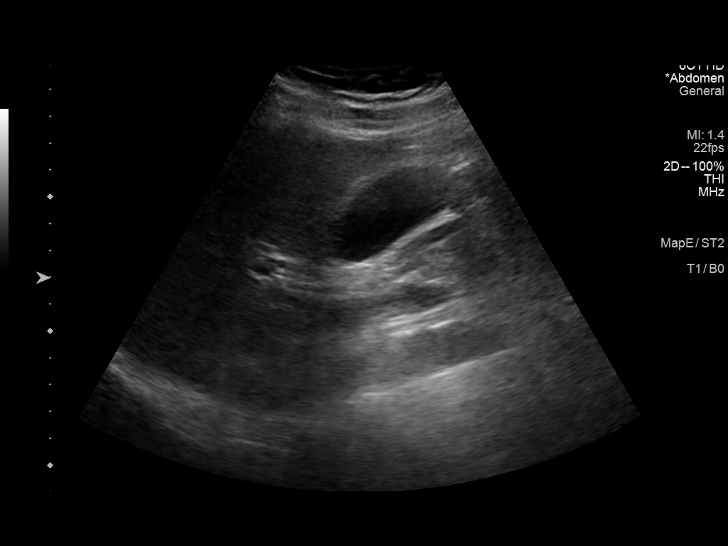
[im 14/82]
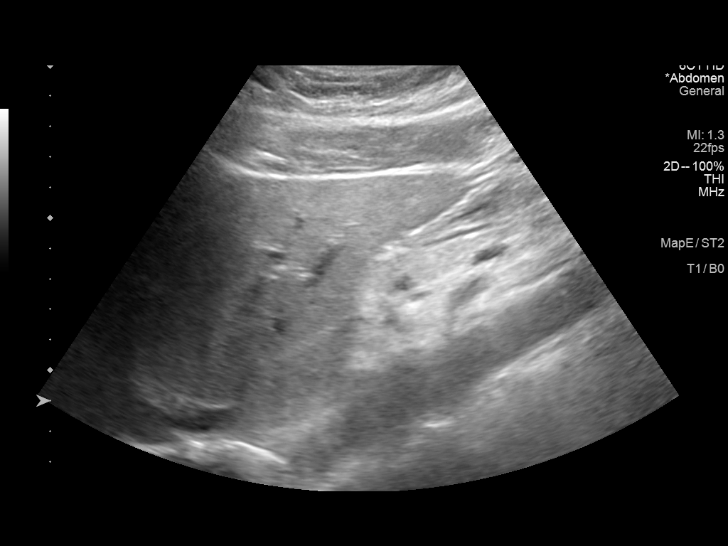
[im 21/82]
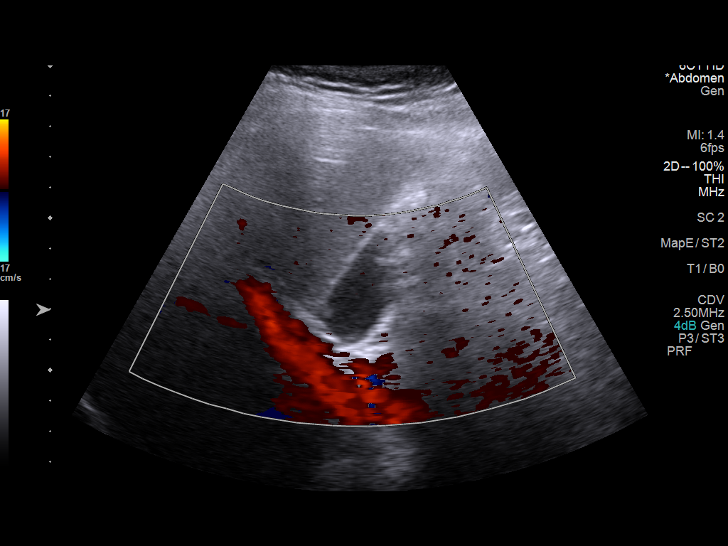
[im 28/82]
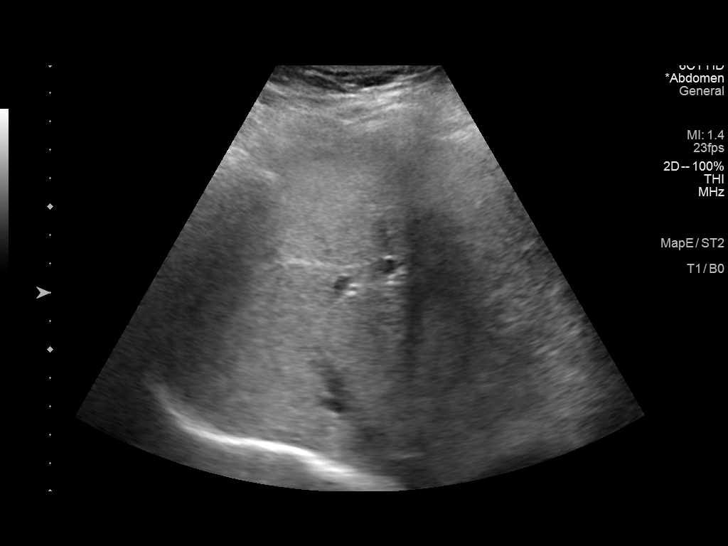
[im 31/82]
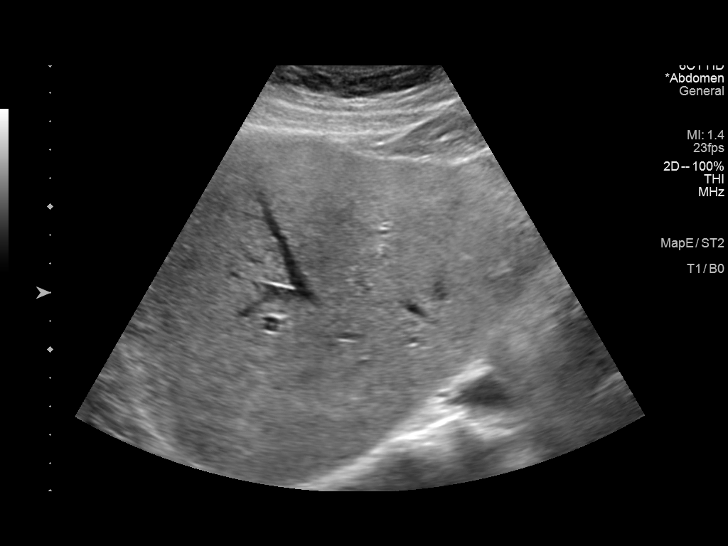
[im 38/82]
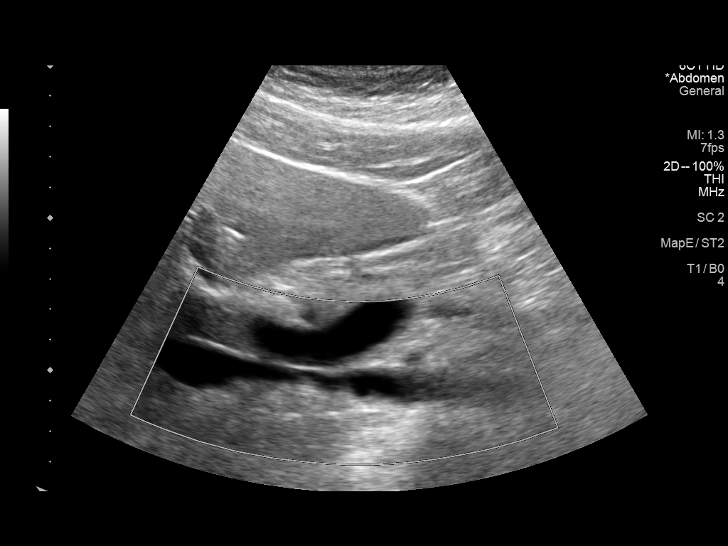
[im 44/82]
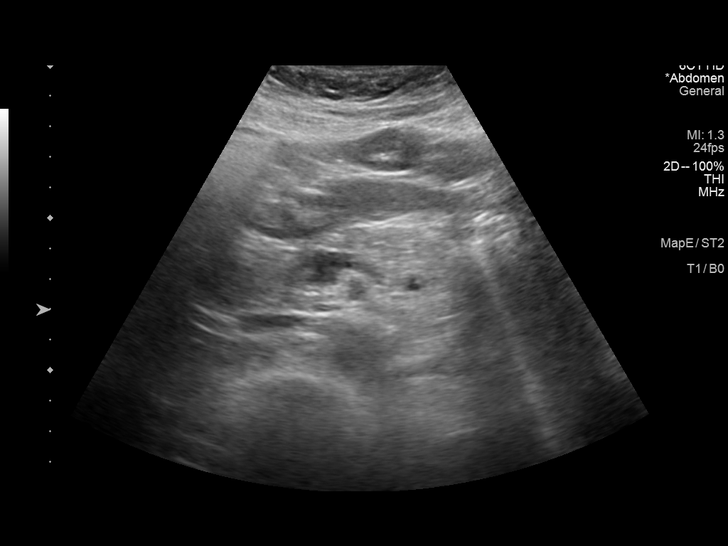
[im 51/82]
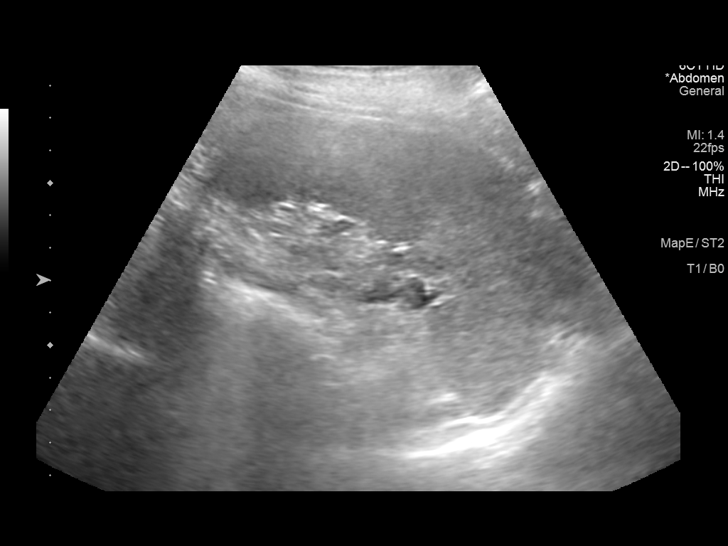
[im 55/82]
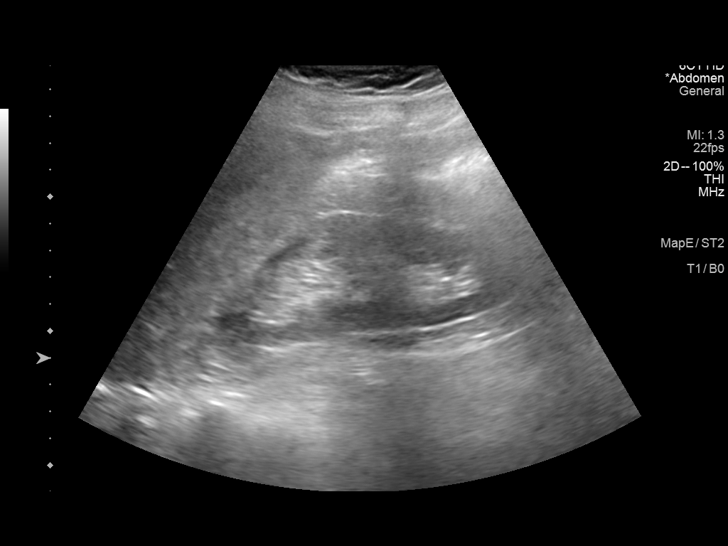
[im 61/82]
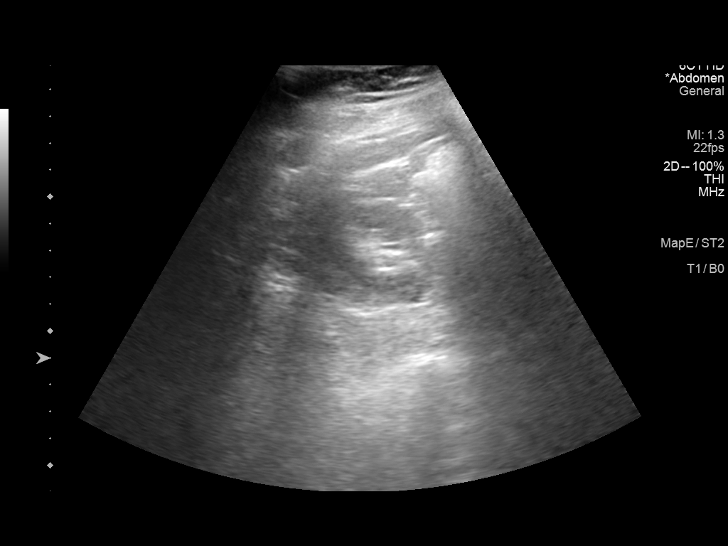
[im 68/82]
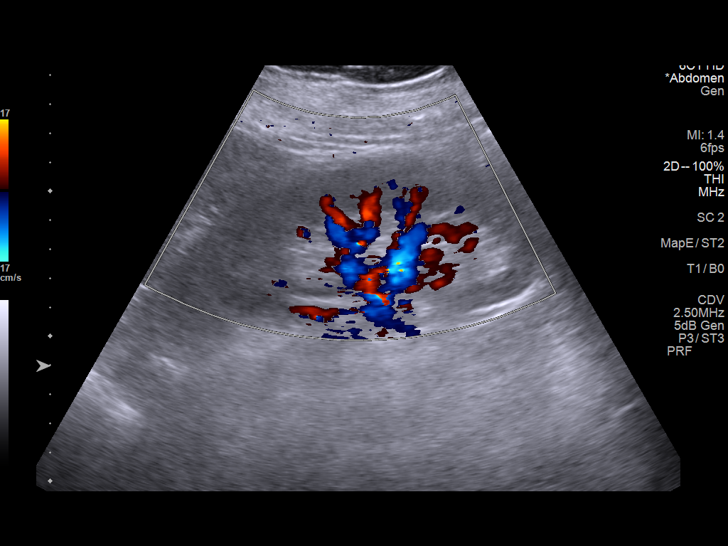
[im 75/82]
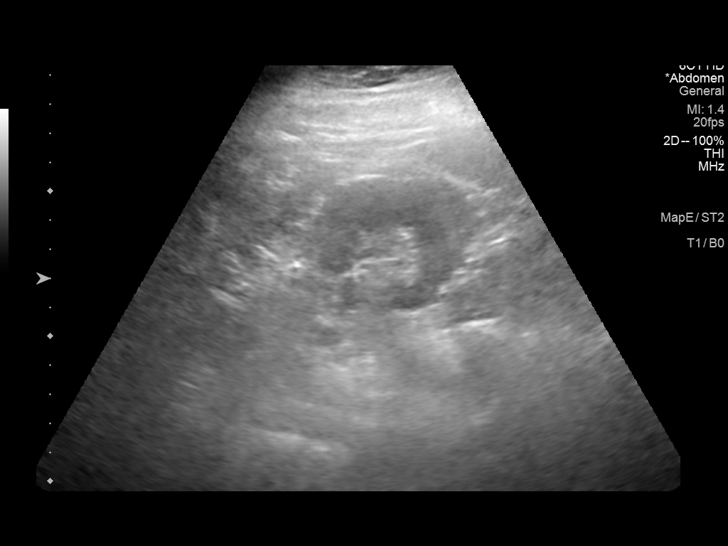
[im 82/82]
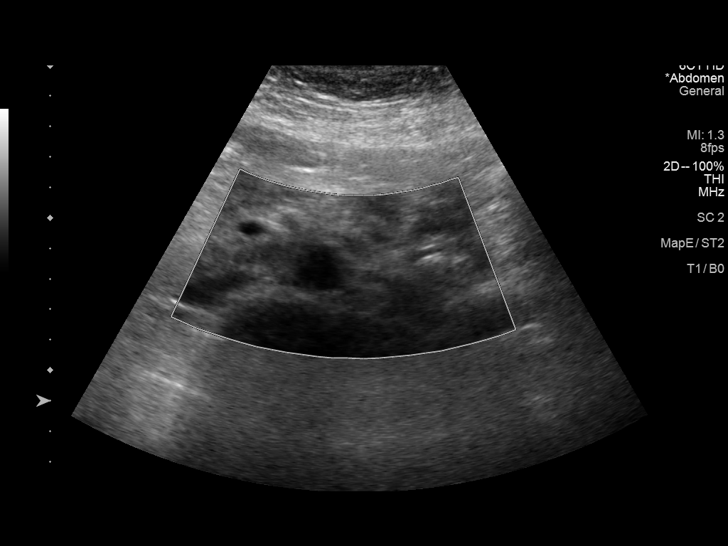

[14 of 25 positions shown; findings below may reference images not displayed]

FINDINGS: Gallbladder: No gallstones or wall thickening visualized. No
sonographic Murphy sign noted by sonographer.

Common bile duct: Diameter: 5 mm

Liver:

No focal lesion.

Diffusely increased parenchymal echogenicity.

Portal vein is patent on color Doppler imaging with normal direction
of blood flow towards the liver.

IVC: No abnormality visualized.

Pancreas: Visualized portion unremarkable.

Spleen: Size and appearance within normal limits.

Right Kidney: Length: 11.7 cm. Echogenicity within normal limits. No
mass or hydronephrosis visualized.

Left Kidney: Length: 12.1 cm. Echogenicity within normal limits. No
mass or hydronephrosis visualized.

Abdominal aorta: No aneurysm visualized.

Other findings: None.
IMPRESSION: Diffuse increased echogenicity of the hepatic parenchyma is a
nonspecific indicator of hepatocellular dysfunction, most commonly
steatosis.

## 2022-09-12 ENCOUNTER — Ambulatory Visit: Payer: BC Managed Care – PPO | Admitting: Family Medicine

## 2022-09-15 ENCOUNTER — Other Ambulatory Visit: Payer: Self-pay | Admitting: Family

## 2022-09-15 ENCOUNTER — Encounter: Payer: Self-pay | Admitting: Family

## 2022-09-15 MED ORDER — AMOXICILLIN-POT CLAVULANATE 875-125 MG PO TABS: 1.0000 | ORAL_TABLET | Freq: Two times a day (BID) | ORAL | 0 refills | Status: AC

## 2022-10-03 ENCOUNTER — Ambulatory Visit: Payer: BC Managed Care – PPO | Admitting: Family Medicine

## 2022-10-12 ENCOUNTER — Telehealth (INDEPENDENT_AMBULATORY_CARE_PROVIDER_SITE_OTHER): Payer: BC Managed Care – PPO | Admitting: Family

## 2022-10-12 ENCOUNTER — Encounter: Payer: Self-pay | Admitting: Family

## 2022-10-12 VITALS — Temp 98.8°F | Ht 67.0 in | Wt 185.0 lb

## 2022-10-12 DIAGNOSIS — G43809 Other migraine, not intractable, without status migrainosus: Secondary | ICD-10-CM

## 2022-10-12 MED ORDER — AZITHROMYCIN 250 MG PO TABS
ORAL_TABLET | ORAL | 0 refills | Status: DC
Start: 2022-10-12 — End: 2023-07-12

## 2022-10-12 MED ORDER — PREDNISONE 20 MG PO TABS: 20.0000 mg | ORAL_TABLET | Freq: Every day | ORAL | 0 refills | Status: DC

## 2022-10-12 NOTE — Progress Notes (Signed)
Margaret Rich is a 41 y.o. female with the following history as recorded in Epic[facility]:  Patient Active Problem List   Diagnosis Date Noted   Somatic dysfunction of spine, sacral 08/03/2022   Joint hyperextensibility of multiple sites 07/12/2022   Gastroparesis 07/12/2022   Non-restorative sleep 04/27/2022   Snoring 04/27/2022   Sleep walking 04/27/2022   Excessive daytime sleepiness 04/11/2022   Chronic fatigue 04/11/2022   Carpal tunnel syndrome, right 01/18/2021   Biceps tendinitis of right upper extremity 01/18/2021   Anal fissure 09/24/2018   Anal pain 09/24/2018   Rectal bleeding 09/24/2018   Frequent headaches 07/31/2018   GERD (gastroesophageal reflux disease) 07/31/2018   High blood pressure 07/31/2018   Migraines 07/31/2018   Urine incontinence 07/31/2018   Urinary tract infection 07/31/2018    Current Outpatient Medications  Medication Sig Dispense Refill   azithromycin (ZITHROMAX) 250 MG tablet 2 tabs po qd x 1 day; 1 tablet per day x 4 days; 6 tablet 0   buPROPion (WELLBUTRIN XL) 150 MG 24 hr tablet Take 1 tablet (150 mg total) by mouth daily. 90 tablet 2   LARIN FE 1/20 1-20 MG-MCG tablet Take 1 tablet by mouth daily.     OVER THE COUNTER MEDICATION Take 1 tablet by mouth 2 (two) times daily. Migrelief + M     predniSONE (DELTASONE) 20 MG tablet Take 1 tablet (20 mg total) by mouth daily with breakfast. 5 tablet 0   SUMAtriptan (IMITREX) 25 MG tablet Take 1 tablet (25 mg total) by mouth every 2 (two) hours as needed for migraine. May repeat in 2 hours if headache persists or recurs. 10 tablet 1   No current facility-administered medications for this visit.    Allergies: Latex  No past medical history on file.  No past surgical history on file.  Family History  Problem Relation Age of Onset   Alcohol abuse Father    Cancer Father    Alcohol abuse Brother    Cancer Maternal Grandmother    Diabetes Maternal Grandmother    High blood pressure Maternal  Grandmother    Alcohol abuse Maternal Grandfather    Heart disease Maternal Grandfather    High Cholesterol Maternal Grandfather    High blood pressure Maternal Grandfather    Alcohol abuse Paternal Grandmother    Alcohol abuse Paternal Grandfather    Cancer Paternal Grandfather    Colon cancer Neg Hx    Esophageal cancer Neg Hx    Inflammatory bowel disease Neg Hx    Liver disease Neg Hx    Pancreatic cancer Neg Hx    Rectal cancer Neg Hx    Stomach cancer Neg Hx     Social History   Tobacco Use   Smoking status: Former    Years: 5.00    Types: Cigarettes    Quit date: 05/18/2008    Years since quitting: 14.4   Smokeless tobacco: Never   Tobacco comments:    Social smoker;   Substance Use Topics   Alcohol use: Yes    Subjective:    I connected with Marlana D Pusey on 10/13/22 at  1:40 PM EDT by a video enabled telemedicine application and verified that I am speaking with the correct person using two identifiers.   I discussed the limitations of evaluation and management by telemedicine and the availability of in person appointments. The patient expressed understanding and agreed to proceed. Provider in office/ patient is at home; provider and patient are only 2 people  on video call.   Sent a message on early Thursday that she was concerned about repeat sinus infection because she suddenly developed "terrible headache" that would not go away;  Feels symptoms were similar to what she messaged about 1 month ago; Upon clarification, she notes that has had some congestion but the headache started suddenly; took 1 Imitrex ( only 25 mg) and did get relief for about 45 minutes after drinking caffeine;       Objective:  Vitals:   10/12/22 1327  Temp: 98.8 F (37.1 C)  TempSrc: Oral  Weight: 185 lb (83.9 kg)  Height: 5\' 7"  (1.702 m)    General: Well developed, well nourished, in no acute distress  Skin : Warm and dry.  Head: Normocephalic and atraumatic  Lungs:  Respirations unlabored;  Neurologic: Alert and oriented; speech intact; face symmetrical; moves all extremities well; CNII-XII intact without focal deficit   Assessment:  1. Other migraine without status migrainosus, not intractable     Plan:  Am more suspicious that symptoms are c/w migraine and not a sinus infection; will give trial of prednisone and agree to short term antibiotic; Stressed to patient that she will need to be seen in the office for further evaluation if the symptoms persist and she agrees.   No follow-ups on file.  No orders of the defined types were placed in this encounter.   Requested Prescriptions   Signed Prescriptions Disp Refills   azithromycin (ZITHROMAX) 250 MG tablet 6 tablet 0    Sig: 2 tabs po qd x 1 day; 1 tablet per day x 4 days;   predniSONE (DELTASONE) 20 MG tablet 5 tablet 0    Sig: Take 1 tablet (20 mg total) by mouth daily with breakfast.

## 2022-11-02 ENCOUNTER — Encounter: Payer: Self-pay | Admitting: Neurology

## 2022-11-03 ENCOUNTER — Other Ambulatory Visit: Payer: Self-pay | Admitting: Neurology

## 2022-11-03 MED ORDER — SUNOSI 150 MG PO TABS: 150.0000 mg | ORAL_TABLET | Freq: Every day | ORAL | 2 refills | Status: DC

## 2022-11-03 NOTE — Progress Notes (Signed)
Excessive daytime sleepiness in a patient with OSA. Failed vyvanse, wellbutrin, quelbre. Lets start sunosi. CD

## 2022-11-06 ENCOUNTER — Other Ambulatory Visit: Payer: Self-pay | Admitting: *Deleted

## 2022-11-06 MED ORDER — MODAFINIL 200 MG PO TABS: 100.0000 mg | ORAL_TABLET | ORAL | 5 refills | Status: DC

## 2022-11-06 NOTE — Telephone Encounter (Signed)
PA Sunosi submitted on CMM. Key: [id number]. Waiting on determination from [employer].

## 2022-11-07 NOTE — Telephone Encounter (Signed)
Received fax from [employer] that PA approved 11/07/22-11/08/23. PA# [facility] Plan [phone] Non-grandfathered [phone].

## 2022-11-07 NOTE — Telephone Encounter (Signed)
PA modafinil submitted on covermymeds. Key: [id number]. Waiting on determination from [employer].

## 2022-11-15 NOTE — Telephone Encounter (Signed)
I'd recommend she try to take 1/4 pill at least one more time to see if the side effects persist or if it was just with the first dose. If she still can't tolerate it then we can try to get the sunosi approved again

## 2023-02-28 ENCOUNTER — Other Ambulatory Visit: Payer: Self-pay | Admitting: Family

## 2023-05-25 ENCOUNTER — Encounter: Payer: Self-pay | Admitting: Family

## 2023-06-07 ENCOUNTER — Other Ambulatory Visit: Payer: Self-pay | Admitting: Family

## 2023-07-08 ENCOUNTER — Encounter: Payer: Self-pay | Admitting: Family

## 2023-07-11 ENCOUNTER — Encounter: Payer: Self-pay | Admitting: Neurology

## 2023-07-12 ENCOUNTER — Encounter: Payer: Self-pay | Admitting: Neurology

## 2023-07-12 ENCOUNTER — Telehealth: Payer: Self-pay | Admitting: Neurology

## 2023-07-12 ENCOUNTER — Ambulatory Visit: Payer: BC Managed Care – PPO | Admitting: Neurology

## 2023-07-12 VITALS — BP 117/72 | HR 93 | Ht 67.0 in | Wt 185.0 lb

## 2023-07-12 DIAGNOSIS — G4719 Other hypersomnia: Secondary | ICD-10-CM

## 2023-07-12 DIAGNOSIS — G4733 Obstructive sleep apnea (adult) (pediatric): Secondary | ICD-10-CM | POA: Insufficient documentation

## 2023-07-12 DIAGNOSIS — Z91199 Patient's noncompliance with other medical treatment and regimen due to unspecified reason: Secondary | ICD-10-CM | POA: Insufficient documentation

## 2023-07-12 NOTE — Progress Notes (Signed)
Provider:  Melvyn Novas, MD  Primary Care Physician:  Olive Bass, FNP [address] [place] [address]     Referring Provider: Olive Bass, Fnp [address] [place],  [place] [address]          Chief Complaint according to patient   Patient presents with:     Sleep CPAP Patient (Initial Visit) mild OSA with CPAP. She states she has not used in months, states she felt her face was hurting daily and having sinus infections with it.            HISTORY OF PRESENT ILLNESS:  Margaret Rich is a 42 y.o. 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 Marland Kitchen  Chief concern according to patient :  " I got more headaches ans sinus problems while on CPAP".  She reports she lives in a humid house, has not noted sleep problems while off CPAP but much less headaches.  She is getting not enough hours  of sleep. Her son wakes her up frequently, both kids share a bedroom.  I have here 7 days of compliance data including 04 January 2023 and that time the residual AHI was 0.3/h so CPAP definitely controls the apnea but she woke up with sinus headaches.  Minimum pressure setting is 5 maximum pressure setting 13 and the EPR level is 3 cm water she did not have significant air leakage the 95th percentile was 2.7 L a minute.  That means the mask is fitting well.  The night the 95th percentile pressure was 7.4 cm water which is low.  And we have to see this in relation to her baseline AHI.  Total Recording Time (hours, min): For this home sleep test total recording time was 8 hours 37 minutes, total sleep time was 7 hours and 8 minutes                  Percent REM (%):     23.6/h                                   Respiratory Indices:   Calculated pAHI (per hour): 10.9/h                           REM pAHI: 18.7/h                                                NREM pAHI:   8.7/h                           Positional  AHI:   There were no positional or snoring data.     She is aware that REM apnea will not be sufficiently treated by a dental device, she has Carylon Perches danlos and needs added pressure plus is at higher risk of TMJ,  Still I think she may try a dental device and pursue weight loss.  07-12-2022:  Margaret Rich is a 42 y.o. Caucasian female patient and seen in a Rv on 07/12/2022 , Here with new CPAP.  Marland KitchenMy patient is here today she has some looking well but states she still endorsed the fatigue severity scale today at 45 points which is elevated her Epworth sleepiness scale is today endorsed at 13 points which is significantly less than last time.  She underwent a home sleep test on 5-8 2023 which showed an overall mild apnea hypopnea index of 10.9/h which was however double as high during REM sleep in comparison to non-REM REM sleep.  Positional data were not part of the report.     I offered her a auto CPAP device to give it a try and our goal is to see if she has responded with less sleepiness. I attributed to her fatigue that she had also endorsed before partially to depression.     07-12-2022: today's compliance visit shows excellent data and she has used the machine 29 out of 30 days 75% over 4 hours with an average use at time of just 5 hours 2 minutes.  So she could work on 1/2-hour more a day and that would increase her compliance significantly she is using an AutoSet AirSense 10 between 5 and 13 cm water pressure window with 3 cm expiratory relief function her residual AHI is 0.6/h which is a significant reduction.  There are no central apneas emerging.  The 95th percentile pressure is 7.6 cm water and the 95 percentile air leak is 12.4 L/min.  So her mask seems to fit well and I would like her to continue under the current settings if possible.    Margaret Rich is a 42 y.o. Caucasian female  patient seen here as a referral on 04/11/2022 from NP [facility] for a Sleep medicine consultation.  Chief concern according to patient :  patient presents with known snoring and sleepiness, fatigue, waking up gasping for air. She also has an interesting finding of hypermobility and her son is even stronger affected. Had carpal tunnel.  Humorous too: " I am ready good at Yoga" - I can do full split. "   Margaret Rich  has sleep relevant medical history:  morning headaches, fatigue and sleepiness while having sufficient sleep time. Depression. Sleep walking history: yes , absolutely -Night terrors- no. Family medical /sleep history: son had tonsillectomy at age 53. Son with ADHD, poor sleeper. Age 32.  no other family member with OSA, insomnia, nor sleep walkers.    Currently not a snorer on CPAP, no allergies, no GERD.       Review of Systems: Out of a complete 14 system review, the patient complains of only the following symptoms, and all other reviewed systems are negative.:  Fatigue, sleepiness , snoring,  How likely are you to doze in the following situations: 0 = not likely, 1 = slight chance, 2 = moderate chance, 3 = high chance   Sitting and Reading? Watching Television? Sitting inactive in a public place (theater or meeting)? As a passenger in a car for an hour without a break? Lying down in the afternoon when circumstances permit? Sitting and talking to someone? Sitting quietly after lunch without alcohol? In a car, while stopped for a few minutes in traffic?   Total = 14/ 24 points   FSS endorsed at XYZ/ 63 points.   Social History   Socioeconomic History   Marital status: Married    Spouse name: Not  on file   Number of children: 2   Years of education: Not on file   Highest education level: Not on file  Occupational History   Not on file  Tobacco Use   Smoking status: Former    Current packs/day: 0.00    Types: Cigarettes    Start date: 05/19/2003    Quit date:  05/18/2008    Years since quitting: 15.1   Smokeless tobacco: Never   Tobacco comments:    Social smoker;   Vaping Use   Vaping status: Never Used  Substance and Sexual Activity   Alcohol use: Yes   Drug use: Never   Sexual activity: Not on file  Other Topics Concern   Not on file  Social History Narrative   Not on file   Social Determinants of Health   Financial Resource Strain: Not on file  Food Insecurity: Not on file  Transportation Needs: Not on file  Physical Activity: Not on file  Stress: Not on file  Social Connections: Not on file    Family History  Problem Relation Age of Onset   Alcohol abuse Father    Cancer Father    Alcohol abuse Brother    Cancer Maternal Grandmother    Diabetes Maternal Grandmother    High blood pressure Maternal Grandmother    Alcohol abuse Maternal Grandfather    Heart disease Maternal Grandfather    High Cholesterol Maternal Grandfather    High blood pressure Maternal Grandfather    Alcohol abuse Paternal Grandmother    Alcohol abuse Paternal Grandfather    Cancer Paternal Grandfather    Colon cancer Neg Hx    Esophageal cancer Neg Hx    Inflammatory bowel disease Neg Hx    Liver disease Neg Hx    Pancreatic cancer Neg Hx    Rectal cancer Neg Hx    Stomach cancer Neg Hx     No past medical history on file.  No past surgical history on file.   Current Outpatient Medications on File Prior to Visit  Medication Sig Dispense Refill   Omega-3 Fatty Acids (FISH OIL) 300 MG CAPS Take by mouth.     vitamin E 180 MG (400 UNITS) capsule Take 400 Units by mouth daily.     buPROPion (WELLBUTRIN XL) 150 MG 24 hr tablet Take 1 tablet (150 mg total) by mouth daily. 90 tablet 2   LARIN FE 1/20 1-20 MG-MCG tablet Take 1 tablet by mouth daily.     OVER THE COUNTER MEDICATION Take 1 tablet by mouth 2 (two) times daily. Migrelief + M     SUMAtriptan (IMITREX) 25 MG tablet TAKE 1 TABLET BY MOUTH EVERY 2 HOURS FOR MIGRAINE. MAY REPEAT IN 2 HOURS  IF HEADACHE PERSISTS OR RECURS. 10 tablet 0   No current facility-administered medications on file prior to visit.    Allergies  Allergen Reactions   Latex Dermatitis, Hives, Itching and Rash     DIAGNOSTIC DATA (LABS, IMAGING, TESTING) - I reviewed patient records, labs, notes, testing and imaging myself where available.  Lab Results  Component Value Date   WBC 8.3 06/23/2021   HGB 12.1 06/23/2021   HCT 36.6 06/23/2021   MCV 82.5 06/23/2021   PLT 204.0 06/23/2021      Component Value Date/Time   NA 139 06/23/2021 1025   K 3.8 06/23/2021 1025   CL 106 06/23/2021 1025   CO2 23 06/23/2021 1025   GLUCOSE 79 06/23/2021 1025   BUN 12 06/23/2021  1025   CREATININE 0.77 06/23/2021 1025   CALCIUM 9.3 06/23/2021 1025   PROT 7.3 06/23/2021 1025   ALBUMIN 4.6 06/23/2021 1025   AST 15 06/23/2021 1025   ALT 15 06/23/2021 1025   ALKPHOS 59 06/23/2021 1025   BILITOT 0.6 06/23/2021 1025   No results found for: "CHOL", "HDL", "LDLCALC", "LDLDIRECT", "TRIG", "CHOLHDL" Lab Results  Component Value Date   HGBA1C 5.5 05/11/2020   Lab Results  Component Value Date   VITAMINB12 522 05/11/2020   Lab Results  Component Value Date   TSH 3.36 06/23/2021    PHYSICAL EXAM:  Today's Vitals   07/12/23 1039  BP: 117/72  Pulse: 93  Weight: 185 lb (83.9 kg)  Height: 5\' 7"  (1.702 m)   Body mass index is 28.98 kg/m.   Wt Readings from Last 3 Encounters:  07/12/23 185 lb (83.9 kg)  10/12/22 185 lb (83.9 kg)  08/02/22 188 lb (85.3 kg)     Ht Readings from Last 3 Encounters:  07/12/23 5\' 7"  (1.702 m)  10/12/22 5\' 7"  (1.702 m)  08/02/22 5\' 8"  (1.727 m)      General: The patient is awake, alert and appears not in acute distress. The patient is well groomed. Head: Normocephalic, atraumatic. Neck is supple. Mallampati: 3,  neck circumference:14.75 inches . Nasal airflow is patent.  Retrognathia is not seen.  Dental status: biological   TMJ click.  Cardiovascular:  Regular rate  and cardiac rhythm by pulse,  without distended neck veins. Respiratory: Lungs are clear to auscultation.  Skin:  Without evidence of ankle edema, or rash. Trunk: The patient's posture is erect.   Neurologic exam : The patient is awake and alert, oriented to place and time.   Memory subjective described as intact.  Attention span & concentration ability appears normal.  Speech is fluent,  without  dysarthria, dysphonia or aphasia.  Mood and affect are appropriate.   Cranial nerves: no loss of smell or taste reported  Pupils are equal and briskly reactive to light.  Funduscopic exam- wears contacts, no cataract.  Extraocular movements in vertical and horizontal planes were intact and without nystagmus. No Diplopia. Visual fields by finger perimetry are intact. Hearing was intact to soft voice and finger rubbing.    Facial sensation intact to fine touch.  Facial motor strength is symmetric and tongue and uvula move midline.  Neck ROM : rotation, tilt and flexion extension were normal for age and shoulder shrug was symmetrical.    Motor exam:  Symmetric bulk, tone and ROM.   Relaxed lower  tone without cog-wheeling, symmetric grip strength.   Sensory:  Fine touch, pinprick and vibration were tested  and  normal.  Proprioception tested in the upper extremities was normal.   Coordination: Rapid alternating movements in the fingers/hands were of normal speed.  The Finger-to-nose maneuver was intact without evidence of ataxia, dysmetria or tremor.    ASSESSMENT AND PLAN 42 y.o. year old female  here with:    1) OSA , mild , REM dependent, CPAP now non compliant for the last 6 months , needs effective weight loss , peri- menopausal diaphoresis to be treated by GYN.   May be a good candidate for mounjaro or wegovy ???  2)  she wants not to return to CPAP , is deemed non -compliant now can bring the machine back to DME.  I will refer to sleep dentist , Dr Irene Limbo , and set the  expectation. Snoring can be reduced ,  apnea ( the NREM sleep apnea part ) can be treated,  EDS - Carylon Perches- Danlos predisposes to lower muscle tone , and weight is too.  Has a higher risk of TMJ.    Plan : referral to Dental sleep.  She is too mild an apnea patient for inspire.  Prn follow up:   I would like to thank Olive Bass, FNP and Olive Bass, Fnp [address] [place],  [place] [address] for allowing me to meet with and to take care of this pleasant patient.     After spending a total time of  30  minutes face to face and additional time for physical and neurologic examination, review of laboratory studies,  personal review of imaging studies, reports and results of other testing and review of referral information / records as far as provided in visit,   Electronically signed by: Melvyn Novas, MD 07/12/2023 11:07 AM  [facility] Board certified by The [employer] of Sleep Medicine and Diplomate of the [employer] of Sleep Medicine. Board certified In Neurology through the ABPN, Fellow of the [employer] of Neurology.

## 2023-07-12 NOTE — Telephone Encounter (Signed)
Referral for dentistry fax to [facility]. Phone: [phone], Fax: [phone].

## 2023-07-12 NOTE — Patient Instructions (Signed)
Living With Sleep Apnea Sleep apnea is a condition in which breathing pauses or becomes shallow during sleep. Sleep apnea is most commonly caused by a collapsed or blocked airway. People with sleep apnea usually snore loudly. They may have times when they gasp and stop breathing for 10 seconds or more during sleep. This may happen many times during the night. The breaks in breathing also interrupt the deep sleep that you need to feel rested. Even if you do not completely wake up from the gaps in breathing, your sleep may not be restful and you feel tired during the day. You may also have a headache in the morning and low energy during the day, and you may feel anxious or depressed. How can sleep apnea affect me? Sleep apnea increases your chances of extreme tiredness during the day (daytime fatigue). It can also increase your risk for health conditions, such as: Heart attack. Stroke. Obesity. Type 2 diabetes. Heart failure. Irregular heartbeat. High blood pressure. If you have daytime fatigue as a result of sleep apnea, you may be more likely to: Perform poorly at school or work. Fall asleep while driving. Have difficulty with attention. Develop depression or anxiety. Have sexual dysfunction. What actions can I take to manage sleep apnea? Sleep apnea treatment  If you were given a device to open your airway while you sleep, use it only as told by your health care provider. You may be given: An oral appliance. This is a custom-made mouthpiece that shifts your lower jaw forward. A continuous positive airway pressure (CPAP) device. This device blows air through a mask when you breathe out (exhale). A nasal expiratory positive airway pressure (EPAP) device. This device has valves that you put into each nostril. A bi-level positive airway pressure (BIPAP) device. This device blows air through a mask when you breathe in (inhale) and breathe out (exhale). You may need surgery if other treatments  do not work for you. Sleep habits Go to sleep and wake up at the same time every day. This helps set your internal clock (circadian rhythm) for sleeping. If you stay up later than usual, such as on weekends, try to get up in the morning within 2 hours of your normal wake time. Try to get at least 7-9 hours of sleep each night. Stop using a computer, tablet, and mobile phone a few hours before bedtime. Do not take long naps during the day. If you nap, limit it to 30 minutes. Have a relaxing bedtime routine. Reading or listening to music may relax you and help you sleep. Use your bedroom only for sleep. Keep your television and computer out of your bedroom. Keep your bedroom cool, dark, and quiet. Use a supportive mattress and pillows. Follow your health care provider's instructions for other changes to sleep habits. Nutrition Do not eat heavy meals in the evening. Do not have caffeine in the later part of the day. The effects of caffeine can last for more than 5 hours. Follow your health care provider's or dietitian's instructions for any diet changes. Lifestyle     Do not drink alcohol before bedtime. Alcohol can cause you to fall asleep at first, but then it can cause you to wake up in the middle of the night and have trouble getting back to sleep. Do not use any products that contain nicotine or tobacco. These products include cigarettes, chewing tobacco, and vaping devices, such as e-cigarettes. If you need help quitting, ask your health care provider. Medicines Take   over-the-counter and prescription medicines only as told by your health care provider. Do not use over-the-counter sleep medicine. You can become dependent on this medicine, and it can make sleep apnea worse. Do not use medicines, such as sedatives and narcotics, unless told by your health care provider. Activity Exercise on most days, but avoid exercising in the evening. Exercising near bedtime can interfere with  sleeping. If possible, spend time outside every day. Natural light helps regulate your circadian rhythm. General information Lose weight if you need to, and maintain a healthy weight. Keep all follow-up visits. This is important. If you are having surgery, make sure to tell your health care provider that you have sleep apnea. You may need to bring your device with you. Where to find more information Learn more about sleep apnea and daytime fatigue from: American Sleep Association: sleepassociation.org National Sleep Foundation: sleepfoundation.org National Heart, Lung, and Blood Institute: nhlbi.nih.gov Summary Sleep apnea is a condition in which breathing pauses or becomes shallow during sleep. Sleep apnea can cause daytime fatigue and other serious health conditions. You may need to wear a device while sleeping to help keep your airway open. If you are having surgery, make sure to tell your health care provider that you have sleep apnea. You may need to bring your device with you. Making changes to sleep habits, diet, lifestyle, and activity can help you manage sleep apnea. This information is not intended to replace advice given to you by your health care provider. Make sure you discuss any questions you have with your health care provider. Document Revised: 07/13/2021 Document Reviewed: 11/12/2020 Elsevier Patient Education  2023 [employer] Inc.  

## 2023-07-17 ENCOUNTER — Other Ambulatory Visit: Payer: Self-pay | Admitting: Family

## 2023-08-24 ENCOUNTER — Ambulatory Visit: Payer: BC Managed Care – PPO | Admitting: Family

## 2023-08-24 ENCOUNTER — Encounter: Payer: Self-pay | Admitting: Family

## 2023-08-24 VITALS — BP 132/80 | HR 90 | Resp 18 | Ht 67.0 in | Wt 187.6 lb

## 2023-08-24 DIAGNOSIS — R5382 Chronic fatigue, unspecified: Secondary | ICD-10-CM

## 2023-08-24 DIAGNOSIS — R635 Abnormal weight gain: Secondary | ICD-10-CM | POA: Diagnosis not present

## 2023-08-24 DIAGNOSIS — G43109 Migraine with aura, not intractable, without status migrainosus: Secondary | ICD-10-CM

## 2023-08-24 LAB — CBC WITH DIFFERENTIAL/PLATELET
Basophils Absolute: 0.1 10*3/uL (ref 0.0–0.1)
Basophils Relative: 0.8 % (ref 0.0–3.0)
Eosinophils Absolute: 0.1 10*3/uL (ref 0.0–0.7)
Eosinophils Relative: 1.4 % (ref 0.0–5.0)
HCT: 40.1 % (ref 36.0–46.0)
Hemoglobin: 12.9 g/dL (ref 12.0–15.0)
Lymphocytes Relative: 20.7 % (ref 12.0–46.0)
Lymphs Abs: 2.2 10*3/uL (ref 0.7–4.0)
MCHC: 32.2 g/dL (ref 30.0–36.0)
MCV: 87.9 fl (ref 78.0–100.0)
Monocytes Absolute: 0.6 10*3/uL (ref 0.1–1.0)
Monocytes Relative: 5.8 % (ref 3.0–12.0)
Neutro Abs: 7.5 10*3/uL (ref 1.4–7.7)
Neutrophils Relative %: 71.3 % (ref 43.0–77.0)
Platelets: 253 10*3/uL (ref 150.0–400.0)
RBC: 4.57 Mil/uL (ref 3.87–5.11)
RDW: 14.8 % (ref 11.5–15.5)
WBC: 10.5 10*3/uL (ref 4.0–10.5)

## 2023-08-24 LAB — COMPREHENSIVE METABOLIC PANEL
ALT: 15 U/L (ref 0–35)
AST: 15 U/L (ref 0–37)
Albumin: 4.2 g/dL (ref 3.5–5.2)
Alkaline Phosphatase: 47 U/L (ref 39–117)
BUN: 12 mg/dL (ref 6–23)
CO2: 24 meq/L (ref 19–32)
Calcium: 9.5 mg/dL (ref 8.4–10.5)
Chloride: 106 meq/L (ref 96–112)
Creatinine, Ser: 0.82 mg/dL (ref 0.40–1.20)
GFR: 87.03 mL/min (ref >60.00)
Glucose, Bld: 97 mg/dL (ref 70–99)
Potassium: 4.2 meq/L (ref 3.5–5.1)
Sodium: 139 meq/L (ref 135–145)
Total Bilirubin: 0.4 mg/dL (ref 0.2–1.2)
Total Protein: 7 g/dL (ref 6.0–8.3)

## 2023-08-24 LAB — TSH: TSH: 2.68 u[IU]/mL (ref 0.35–5.50)

## 2023-08-24 LAB — HEMOGLOBIN A1C: Hgb A1c MFr Bld: 5.6 % (ref 4.6–6.5)

## 2023-08-24 MED ORDER — SUMATRIPTAN SUCCINATE 25 MG PO TABS: 25.0000 mg | ORAL_TABLET | ORAL | 2 refills | Status: AC | PRN

## 2023-08-24 MED ORDER — BUPROPION HCL ER (XL) 150 MG PO TB24: 150.0000 mg | ORAL_TABLET | Freq: Every day | ORAL | 3 refills | Status: DC

## 2023-08-24 MED ORDER — ONDANSETRON HCL 4 MG PO TABS: 4.0000 mg | ORAL_TABLET | Freq: Three times a day (TID) | ORAL | 0 refills | Status: AC | PRN

## 2023-08-24 NOTE — Progress Notes (Signed)
Margaret Rich is a 42 y.o. female with the following history as recorded in Epic[employer]:  Patient Active Problem List   Diagnosis Date Noted   Mild obstructive sleep apnea 07/12/2023   Poor compliance with CPAP treatment 07/12/2023   Somatic dysfunction of spine, sacral 08/03/2022   Joint hyperextensibility of multiple sites 07/12/2022   Gastroparesis 07/12/2022   Non-restorative sleep 04/27/2022   Snoring 04/27/2022   Sleep walking 04/27/2022   Excessive daytime sleepiness 04/11/2022   Chronic fatigue 04/11/2022   Carpal tunnel syndrome, right 01/18/2021   Biceps tendinitis of right upper extremity 01/18/2021   Anal fissure 09/24/2018   Anal pain 09/24/2018   Rectal bleeding 09/24/2018   Frequent headaches 07/31/2018   GERD (gastroesophageal reflux disease) 07/31/2018   High blood pressure 07/31/2018   Migraines 07/31/2018   Urine incontinence 07/31/2018   Urinary tract infection 07/31/2018    Current Outpatient Medications  Medication Sig Dispense Refill   Ascorbic Acid (VITAMIN C WITH ROSE HIPS) 500 MG tablet Take 500 mg by mouth daily.     LARIN FE 1/20 1-20 MG-MCG tablet Take 1 tablet by mouth daily.     magnesium gluconate (MAGONATE) 500 MG tablet Take 500 mg by mouth 2 (two) times daily.     multivitamin-lutein (OCUVITE-LUTEIN) CAPS capsule Take 1 capsule by mouth daily.     Omega-3 Fatty Acids (FISH OIL) 300 MG CAPS Take by mouth.     ondansetron (ZOFRAN) 4 MG tablet Take 1 tablet (4 mg total) by mouth every 8 (eight) hours as needed for nausea or vomiting. 20 tablet 0   OVER THE COUNTER MEDICATION Take 1 tablet by mouth 2 (two) times daily. Migrelief + M     buPROPion (WELLBUTRIN XL) 150 MG 24 hr tablet Take 1 tablet (150 mg total) by mouth daily. 90 tablet 3   SUMAtriptan (IMITREX) 25 MG tablet Take 1 tablet (25 mg total) by mouth every 2 (two) hours as needed for migraine. TAKE 1 TABLET BY MOUTH EVERY 2 HOURS FOR MIGRAINE. MAY REPEAT IN 2 HOURS IF HEADACHE PERSISTS OR  RECURS. 10 tablet 2   No current facility-administered medications for this visit.    Allergies: Latex  No past medical history on file.  No past surgical history on file.  Family History  Problem Relation Age of Onset   Alcohol abuse Father    Cancer Father    Alcohol abuse Brother    Cancer Maternal Grandmother    Diabetes Maternal Grandmother    High blood pressure Maternal Grandmother    Alcohol abuse Maternal Grandfather    Heart disease Maternal Grandfather    High Cholesterol Maternal Grandfather    High blood pressure Maternal Grandfather    Alcohol abuse Paternal Grandmother    Alcohol abuse Paternal Grandfather    Cancer Paternal Grandfather    Colon cancer Neg Hx    Esophageal cancer Neg Hx    Inflammatory bowel disease Neg Hx    Liver disease Neg Hx    Pancreatic cancer Neg Hx    Rectal cancer Neg Hx    Stomach cancer Neg Hx     Social History   Tobacco Use   Smoking status: Former    Current packs/day: 0.00    Types: Cigarettes    Start date: 05/19/2003    Quit date: 05/18/2008    Years since quitting: 15.2   Smokeless tobacco: Never   Tobacco comments:    Social smoker;   Substance Use Topics  Alcohol use: Yes    Subjective:   Requesting refill on Wellbutrin XL- doing well; wonders if she is a candidate for weight loss medication;   Objective:  Vitals:   08/24/23 1038  BP: 132/80  Pulse: 90  Resp: 18  SpO2: 98%  Weight: 187 lb 9.6 oz (85.1 kg)  Height: 5\' 7"  (1.702 m)    General: Well developed, well nourished, in no acute distress  Skin : Warm and dry.  Head: Normocephalic and atraumatic  Eyes: Sclera and conjunctiva clear; pupils round and reactive to light; extraocular movements intact  Lungs: Respirations unlabored; clear to auscultation bilaterally without wheeze, rales, rhonchi  CVS exam: normal rate and regular rhythm.  Neurologic: Alert and oriented; speech intact; face symmetrical; moves all extremities well; CNII-XII intact without  focal deficit   Assessment:  1. Weight gain   2. Migraine with aura and without status migrainosus, not intractable   3. Chronic fatigue     Plan:   Update labs today; insurance does not cover GLP weight loss medications; follow up to be determined;  Stable; refill updated;  Stable; refill updated on Wellbutrin XL;    No follow-ups on file.  Orders Placed This Encounter  Procedures   CBC with Differential/Platelet   Comp Met (CMET)   TSH   Hemoglobin A1c    Requested Prescriptions   Signed Prescriptions Disp Refills   buPROPion (WELLBUTRIN XL) 150 MG 24 hr tablet 90 tablet 3    Sig: Take 1 tablet (150 mg total) by mouth daily.   SUMAtriptan (IMITREX) 25 MG tablet 10 tablet 2    Sig: Take 1 tablet (25 mg total) by mouth every 2 (two) hours as needed for migraine. TAKE 1 TABLET BY MOUTH EVERY 2 HOURS FOR MIGRAINE. MAY REPEAT IN 2 HOURS IF HEADACHE PERSISTS OR RECURS.   ondansetron (ZOFRAN) 4 MG tablet 20 tablet 0    Sig: Take 1 tablet (4 mg total) by mouth every 8 (eight) hours as needed for nausea or vomiting.

## 2023-08-24 NOTE — Patient Instructions (Signed)
The [facility] Plan is not currently covering weight loss medications like Wegovy or Zepbound;

## 2023-08-28 LAB — HM MAMMOGRAPHY

## 2023-08-29 NOTE — Telephone Encounter (Signed)
Received a fax from [facility] for an Rx for Oral Application therapy for medically diagnosed OSA. Dr Dohmeier referred pt her for oral device and should be able to prescribe. Attempted to contact office x2 at [phone] for calcification, no answer. Will try again in the morning.

## 2023-09-12 NOTE — Telephone Encounter (Signed)
Faxed document requested by [facility] to proceed with oral sleep appliance.

## 2023-11-28 ENCOUNTER — Telehealth: Payer: Self-pay | Admitting: Family Medicine

## 2023-11-28 ENCOUNTER — Encounter: Payer: Self-pay | Admitting: Family

## 2023-11-28 NOTE — Telephone Encounter (Signed)
Patient called stating that she was previously referred to [facility] for [facility] but she was not able to schedule with them at that time and then her issue begin to get better. It has now come back and she would like to know if we could send the referral to [facility] again? This was last discussed 06/28/2021.  Please advise.

## 2023-11-28 NOTE — Telephone Encounter (Signed)
Sent patient MyChart message. Will need visit.

## 2023-11-29 ENCOUNTER — Other Ambulatory Visit: Payer: Self-pay | Admitting: Family

## 2023-11-29 DIAGNOSIS — G5601 Carpal tunnel syndrome, right upper limb: Secondary | ICD-10-CM

## 2023-12-19 HISTORY — PX: CARPAL TUNNEL RELEASE: SHX101

## 2023-12-26 ENCOUNTER — Ambulatory Visit (INDEPENDENT_AMBULATORY_CARE_PROVIDER_SITE_OTHER): Payer: BC Managed Care – PPO | Admitting: Orthopedic Surgery

## 2023-12-26 ENCOUNTER — Other Ambulatory Visit (INDEPENDENT_AMBULATORY_CARE_PROVIDER_SITE_OTHER): Payer: 59

## 2023-12-26 DIAGNOSIS — G5601 Carpal tunnel syndrome, right upper limb: Secondary | ICD-10-CM

## 2023-12-26 NOTE — Progress Notes (Signed)
 Margaret Rich - 43 y.o. female MRN [id number]  Date of birth: 11-12-1981  Office Visit Note: Visit Date: 12/26/2023 PCP: Jason Leita Repine, FNP Referred by: Jason Leita Repine,*  Subjective: No chief complaint on file.  HPI: Margaret Rich is a pleasant 43 y.o. female who presents today for right hand numbness and tingling, primarily of the ulnar-sided digits.  She states that she does have a history of more diffuse numbness throughout the hand, however on today's evaluation she is isolating and mostly to the ring and small finger.  She is active overall at baseline.  She states that she has been evaluated for carpal tunnel in the past, has undergone prior injections per sports medicine.  She states that the injections were initially helping, she estimates 3 total injections, the most recent one with minimal relief of symptoms approximately 1 year prior.  No prior electrodiagnostic studies of been completed.  She does have history notable for hypermobility of her joints.  Pertinent ROS were reviewed with the patient and found to be negative unless otherwise specified above in HPI.   Visit Reason: right hand numbness and tingling Duration of symptoms: 3 years Hand dominance: right Occupation: [employer] work Diabetic: No Smoking: No Heart/Lung History: none Blood Thinners: none  Prior Testing/EMG: none Injections (Date): 1 year ago- minimal relief Treatments: injection, wrist brace Prior Surgery: none  Assessment & Plan: Visit Diagnoses:  1. Carpal tunnel syndrome of right wrist     Plan: Extensive discussion was had with the patient today regarding her ongoing right upper extremity complaints.  Based on her clinical examination, I am concerned for potential cubital tunnel syndrome that is developing and likely progressing.  There may be associated carpal tunnel syndrome as well given her intermittent symptoms consistent with this finding.  We discussed  the etiology and pathophysiology of nerve compression as well as the treatment modalities ranging from conservative to surgical.  She has already begun some conservative treatments in the form of activity modification, bracing and prior carpal tunnel injections.  We discussed extended elbow positioning particularly at night in order to help alleviate her cubital tunnel syndrome symptoms.  I would like her to obtain a right upper extremity electrodiagnostic study in order to better understand the potential nerve compression and severity in order to help guide her treatment moving forward.  She expressed full understanding, will return to me after the electrodiagnostic study is complete.  Follow-up: No follow-ups on file.   Meds & Orders: No orders of the defined types were placed in this encounter.   Orders Placed This Encounter  Procedures   XR Wrist Complete Right   Ambulatory referral to Physical Medicine Rehab   Ambulatory referral to Occupational Therapy     Procedures: No procedures performed      Clinical History: No specialty comments available.  She reports that she quit smoking about 15 years ago. Her smoking use included cigarettes. She started smoking about 20 years ago. She has never used smokeless tobacco.  Recent Labs    08/24/23 1118  HGBA1C 5.6    Objective:   Vital Signs: There were no vitals taken for this visit.  Physical Exam  Gen: Well-appearing, in no acute distress; non-toxic CV: Regular Rate. Well-perfused. Warm.  Resp: Breathing unlabored on room air; no wheezing. Psych: Fluid speech in conversation; appropriate affect; normal thought process  Ortho Exam PHYSICAL EXAM:  General: Patient is well appearing and in no distress. Cervical spine mobility is  full in all directions:  Skin and Muscle: No significant skin changes are apparent to upper extremities.   Range of Motion and Palpation Tests: Mobility is full about the elbows with flexion and  extension.  No evidence of nerve subluxation at right elbow.  Forearm supination and pronation are 85/85 bilaterally.  Wrist flexion/extension is 75/65 bilaterally.  Digital flexion and extension are full.  Thumb opposition is full to the base of the small fingers bilaterally.    No cords or nodules are palpated.  No triggering is observed.     Neurologic, Vascular, Motor: Sensation is diminished to light touch in the right ulnar distribution.  Intact light touch in the median and radial distributions.   Tinel sign cubital tunnel: Positive right Scratch collapse elbow: Positive right  Sensory right side 2-point discrimination (ring, small): 5 to 6 mm  Motor right side SF FDP: 5/5 Froment: Negative Wartenberg: Negative FDI wasting: Negative  Fingers pink and well perfused.  Capillary refill is brisk.     Lab Results  Component Value Date   HGBA1C 5.6 08/24/2023     Imaging: XR Wrist Complete Right Result Date: 12/26/2023 X-rays of the right wrist, multiple views were completed today X-rays demonstrate stable appearance of the radiocarpal articulation in all planes, midcarpal articulations appear well aligned as well.  There is notable subluxation at the thumb CMC interval, consistent with the hypermobility diagnosis.   Past Medical/Family/Surgical/Social History: Medications & Allergies reviewed per EMR, new medications updated. Patient Active Problem List   Diagnosis Date Noted   Mild obstructive sleep apnea 07/12/2023   Poor compliance with CPAP treatment 07/12/2023   Somatic dysfunction of spine, sacral 08/03/2022   Joint hyperextensibility of multiple sites 07/12/2022   Gastroparesis 07/12/2022   Non-restorative sleep 04/27/2022   Snoring 04/27/2022   Sleep walking 04/27/2022   Excessive daytime sleepiness 04/11/2022   Chronic fatigue 04/11/2022   Carpal tunnel syndrome, right 01/18/2021   Biceps tendinitis of right upper extremity 01/18/2021   Anal fissure  09/24/2018   Anal pain 09/24/2018   Rectal bleeding 09/24/2018   Frequent headaches 07/31/2018   GERD (gastroesophageal reflux disease) 07/31/2018   High blood pressure 07/31/2018   Migraines 07/31/2018   Urine incontinence 07/31/2018   Urinary tract infection 07/31/2018   No past medical history on file. Family History  Problem Relation Age of Onset   Alcohol abuse Father    Cancer Father    Alcohol abuse Brother    Cancer Maternal Grandmother    Diabetes Maternal Grandmother    High blood pressure Maternal Grandmother    Alcohol abuse Maternal Grandfather    Heart disease Maternal Grandfather    High Cholesterol Maternal Grandfather    High blood pressure Maternal Grandfather    Alcohol abuse Paternal Grandmother    Alcohol abuse Paternal Grandfather    Cancer Paternal Grandfather    Colon cancer Neg Hx    Esophageal cancer Neg Hx    Inflammatory bowel disease Neg Hx    Liver disease Neg Hx    Pancreatic cancer Neg Hx    Rectal cancer Neg Hx    Stomach cancer Neg Hx    No past surgical history on file. Social History   Occupational History   Not on file  Tobacco Use   Smoking status: Former    Current packs/day: 0.00    Types: Cigarettes    Start date: 05/19/2003    Quit date: 05/18/2008    Years since quitting: 15.6  Smokeless tobacco: Never   Tobacco comments:    Social smoker;   Vaping Use   Vaping status: Never Used  Substance and Sexual Activity   Alcohol use: Yes   Drug use: Never   Sexual activity: Not on file    Dameshia Seybold Estela) Arlinda, M.D. [facility] 4:51 PM

## 2024-01-04 ENCOUNTER — Ambulatory Visit: Payer: 59 | Admitting: Physical Medicine and Rehabilitation

## 2024-01-04 DIAGNOSIS — R202 Paresthesia of skin: Secondary | ICD-10-CM

## 2024-01-04 DIAGNOSIS — M79601 Pain in right arm: Secondary | ICD-10-CM

## 2024-01-04 DIAGNOSIS — R531 Weakness: Secondary | ICD-10-CM

## 2024-01-04 DIAGNOSIS — M25531 Pain in right wrist: Secondary | ICD-10-CM

## 2024-01-04 NOTE — Progress Notes (Signed)
Patient is scheduled for Thursday morning

## 2024-01-04 NOTE — Progress Notes (Unsigned)
Functional Pain Scale - descriptive words and definitions  Mild (2)   Noticeable when not distracted/no impact on ADL's/sleep only slightly affected and able to   use both passive and active distraction for comfort. Mild range order  Average Pain 2   RUE NCS- pain, numbness and tingling in 3rd and 4th digit. The pain is worse at night. At is worse it is an 8.

## 2024-01-07 ENCOUNTER — Encounter: Payer: Self-pay | Admitting: Physical Medicine and Rehabilitation

## 2024-01-07 NOTE — Procedures (Signed)
EMG & NCV Findings: Evaluation of the right median motor nerve showed prolonged distal onset latency (4.9 ms), reduced amplitude (4.0 mV), and decreased conduction velocity (Elbow-Wrist, 47 m/s).  The right median (across palm) sensory nerve showed prolonged distal peak latency (Wrist, 4.4 ms) and prolonged distal peak latency (Palm, 2.2 ms).  All remaining nerves (as indicated in the following tables) were within normal limits.    All examined muscles (as indicated in the following table) showed no evidence of electrical instability.    Impression: The above electrodiagnostic study is ABNORMAL and reveals evidence of a severe right median nerve entrapment at the wrist (carpal tunnel syndrome) affecting sensory and motor components.   There is no significant electrodiagnostic evidence of any other focal nerve entrapment, brachial plexopathy or cervical radiculopathy.   Recommendations: 1.  Follow-up with referring physician. 2.  Continue current management of symptoms. 3.  Suggest surgical evaluation.  ___________________________ Naaman Plummer FAAPMR Board Certified, American Board of Physical Medicine and Rehabilitation    Nerve Conduction Studies Anti Sensory Summary Table   Stim Site NR Peak (ms) Norm Peak (ms) P-T Amp (V) Norm P-T Amp Site1 Site2 Delta-P (ms) Dist (cm) Vel (m/s) Norm Vel (m/s)  Right Median Acr Palm Anti Sensory (2nd Digit)  29.9C  Wrist    *4.4 <3.6 21.0 >10 Wrist Palm 2.2 0.0    Palm    *2.2 <2.0 21.2         Right Radial Anti Sensory (Base 1st Digit)  29.9C  Wrist    2.3 <3.1 34.1  Wrist Base 1st Digit 2.3 0.0    Right Ulnar Anti Sensory (5th Digit)  30.3C  Wrist    3.4 <3.7 33.0 >15.0 Wrist 5th Digit 3.4 14.0 41 >38   Motor Summary Table   Stim Site NR Onset (ms) Norm Onset (ms) O-P Amp (mV) Norm O-P Amp Site1 Site2 Delta-0 (ms) Dist (cm) Vel (m/s) Norm Vel (m/s)  Right Median Motor (Abd Poll Brev)  30C  Wrist    *4.9 <4.2 *4.0 >5 Elbow Wrist 4.7 22.0  *47 >50  Elbow    9.6  2.4         Right Ulnar Motor (Abd Dig Min)  30.1C  Wrist    3.2 <4.2 9.1 >3 B Elbow Wrist 3.5 22.0 63 >53  B Elbow    6.7  8.8  A Elbow B Elbow 1.0 10.0 100 >53  A Elbow    7.7  8.9          EMG   Side Muscle Nerve Root Ins Act Fibs Psw Amp Dur Poly Recrt Int Dennie Bible Comment  Right Abd Poll Brev Median C8-T1 Nml Nml Nml Nml Nml 0 Nml Nml   Right 1stDorInt Ulnar C8-T1 Nml Nml Nml Nml Nml 0 Nml Nml   Right PronatorTeres Median C6-7 Nml Nml Nml Nml Nml 0 Nml Nml   Right Biceps Musculocut C5-6 Nml Nml Nml Nml Nml 0 Nml Nml     Nerve Conduction Studies Anti Sensory Left/Right Comparison   Stim Site L Lat (ms) R Lat (ms) L-R Lat (ms) L Amp (V) R Amp (V) L-R Amp (%) Site1 Site2 L Vel (m/s) R Vel (m/s) L-R Vel (m/s)  Median Acr Palm Anti Sensory (2nd Digit)  29.9C  Wrist  *4.4   21.0  Wrist Palm     Palm  *2.2   21.2        Radial Anti Sensory (Base 1st Digit)  29.9C  Wrist  2.3   34.1  Wrist Base 1st Digit     Ulnar Anti Sensory (5th Digit)  30.3C  Wrist  3.4   33.0  Wrist 5th Digit  41    Motor Left/Right Comparison   Stim Site L Lat (ms) R Lat (ms) L-R Lat (ms) L Amp (mV) R Amp (mV) L-R Amp (%) Site1 Site2 L Vel (m/s) R Vel (m/s) L-R Vel (m/s)  Median Motor (Abd Poll Brev)  30C  Wrist  *4.9   *4.0  Elbow Wrist  *47   Elbow  9.6   2.4        Ulnar Motor (Abd Dig Min)  30.1C  Wrist  3.2   9.1  B Elbow Wrist  63   B Elbow  6.7   8.8  A Elbow B Elbow  100   A Elbow  7.7   8.9           Waveforms:

## 2024-01-07 NOTE — Progress Notes (Signed)
Margaret Rich - 43 y.o. female MRN [id number]  Date of birth: 07/06/1981  Office Visit Note: Visit Date: 01/04/2024 PCP: Olive Bass, FNP Referred by: Samuella Cota, MD  Subjective: Chief Complaint  Patient presents with   Right Hand - Pain, Numbness, Tingling   HPI: Margaret Rich is a 43 y.o. female who comes in today at the request of Dr. Samuella Cota for evaluation and management of chronic, worsening and severe pain, numbness and tingling in the Right upper extremities.  Patient is Right hand dominant.  He reports greater than a years worth of symptoms particularly into the right hand and arm with paresthesias and pain and weakness.  She describes the symptoms today and she tells me that she really thought long and hard about this after she saw the hand surgeon that she is really having symptoms into the middle 2 digits and in fact it is the radial side of the fourth digit and the ulnar side of the third digit.  At times she feels like it is the whole hand particularly at night when she wakes up it can be completely numb and tingly.  When she saw Dr. Denese Killings she reported more fourth and fifth digit.  She has noticed decreased ability to manipulate fine objects and weakness and dropping objects.  She has been diagnosed with carpal tunnel syndrome in the past.  Review of the chart shows that as far back as 2022 Dr. Clementeen Graham with [facility] had completed carpal tunnel injection with relief and ultrasound revealed 20 mm cross-section which is above the threshold for moderate to severe median neuropathy.  Nonetheless she has had multiple injections with some relief.  She has tried splinting.  She denies any frank radicular symptoms.  She is not diabetic.   I spent more than 30 minutes speaking face-to-face with the patient with 50% of the time in counseling and discussing coordination of care.       Review of Systems  Musculoskeletal:  Positive for joint  pain.  Neurological:  Positive for tingling and focal weakness.  All other systems reviewed and are negative.  Otherwise per HPI.  Assessment & Plan: Visit Diagnoses:    ICD-10-CM   1. Paresthesia of skin  R20.2 NCV with EMG (electromyography)    2. Pain in right wrist  M25.531     3. Right arm pain  M79.601     4. Weakness  R53.1        Plan: Impression: Differential diagnosis is cervical radiculopathy versus median nerve neuropathy versus ulnar neuropathy.  The above electrodiagnostic study is ABNORMAL and reveals evidence of a severe right median nerve entrapment at the wrist (carpal tunnel syndrome) affecting sensory and motor components.   There is no significant electrodiagnostic evidence of any other focal nerve entrapment, brachial plexopathy or cervical radiculopathy.   Recommendations: 1.  Follow-up with referring physician. 2.  Continue current management of symptoms. 3.  Suggest surgical evaluation.  Meds & Orders: No orders of the defined types were placed in this encounter.   Orders Placed This Encounter  Procedures   NCV with EMG (electromyography)    Follow-up: Return for Anshul Agarwala, MD.   Procedures: No procedures performed  EMG & NCV Findings: Evaluation of the right median motor nerve showed prolonged distal onset latency (4.9 ms), reduced amplitude (4.0 mV), and decreased conduction velocity (Elbow-Wrist, 47 m/s).  The right median (across palm) sensory nerve showed prolonged distal peak latency (Wrist, 4.4 ms)  and prolonged distal peak latency (Palm, 2.2 ms).  All remaining nerves (as indicated in the following tables) were within normal limits.    All examined muscles (as indicated in the following table) showed no evidence of electrical instability.    Impression: The above electrodiagnostic study is ABNORMAL and reveals evidence of a severe right median nerve entrapment at the wrist (carpal tunnel syndrome) affecting sensory and motor  components.   There is no significant electrodiagnostic evidence of any other focal nerve entrapment, brachial plexopathy or cervical radiculopathy.   Recommendations: 1.  Follow-up with referring physician. 2.  Continue current management of symptoms. 3.  Suggest surgical evaluation.  ___________________________ Naaman Plummer FAAPMR Board Certified, American Board of Physical Medicine and Rehabilitation    Nerve Conduction Studies Anti Sensory Summary Table   Stim Site NR Peak (ms) Norm Peak (ms) P-T Amp (V) Norm P-T Amp Site1 Site2 Delta-P (ms) Dist (cm) Vel (m/s) Norm Vel (m/s)  Right Median Acr Palm Anti Sensory (2nd Digit)  29.9C  Wrist    *4.4 <3.6 21.0 >10 Wrist Palm 2.2 0.0    Palm    *2.2 <2.0 21.2         Right Radial Anti Sensory (Base 1st Digit)  29.9C  Wrist    2.3 <3.1 34.1  Wrist Base 1st Digit 2.3 0.0    Right Ulnar Anti Sensory (5th Digit)  30.3C  Wrist    3.4 <3.7 33.0 >15.0 Wrist 5th Digit 3.4 14.0 41 >38   Motor Summary Table   Stim Site NR Onset (ms) Norm Onset (ms) O-P Amp (mV) Norm O-P Amp Site1 Site2 Delta-0 (ms) Dist (cm) Vel (m/s) Norm Vel (m/s)  Right Median Motor (Abd Poll Brev)  30C  Wrist    *4.9 <4.2 *4.0 >5 Elbow Wrist 4.7 22.0 *47 >50  Elbow    9.6  2.4         Right Ulnar Motor (Abd Dig Min)  30.1C  Wrist    3.2 <4.2 9.1 >3 B Elbow Wrist 3.5 22.0 63 >53  B Elbow    6.7  8.8  A Elbow B Elbow 1.0 10.0 100 >53  A Elbow    7.7  8.9          EMG   Side Muscle Nerve Root Ins Act Fibs Psw Amp Dur Poly Recrt Int Dennie Bible Comment  Right Abd Poll Brev Median C8-T1 Nml Nml Nml Nml Nml 0 Nml Nml   Right 1stDorInt Ulnar C8-T1 Nml Nml Nml Nml Nml 0 Nml Nml   Right PronatorTeres Median C6-7 Nml Nml Nml Nml Nml 0 Nml Nml   Right Biceps Musculocut C5-6 Nml Nml Nml Nml Nml 0 Nml Nml     Nerve Conduction Studies Anti Sensory Left/Right Comparison   Stim Site L Lat (ms) R Lat (ms) L-R Lat (ms) L Amp (V) R Amp (V) L-R Amp (%) Site1 Site2 L Vel (m/s) R  Vel (m/s) L-R Vel (m/s)  Median Acr Palm Anti Sensory (2nd Digit)  29.9C  Wrist  *4.4   21.0  Wrist Palm     Palm  *2.2   21.2        Radial Anti Sensory (Base 1st Digit)  29.9C  Wrist  2.3   34.1  Wrist Base 1st Digit     Ulnar Anti Sensory (5th Digit)  30.3C  Wrist  3.4   33.0  Wrist 5th Digit  41    Motor Left/Right Comparison   Stim Site L  Lat (ms) R Lat (ms) L-R Lat (ms) L Amp (mV) R Amp (mV) L-R Amp (%) Site1 Site2 L Vel (m/s) R Vel (m/s) L-R Vel (m/s)  Median Motor (Abd Poll Brev)  30C  Wrist  *4.9   *4.0  Elbow Wrist  *47   Elbow  9.6   2.4        Ulnar Motor (Abd Dig Min)  30.1C  Wrist  3.2   9.1  B Elbow Wrist  63   B Elbow  6.7   8.8  A Elbow B Elbow  100   A Elbow  7.7   8.9           Waveforms:            Clinical History: No specialty comments available.   She reports that she quit smoking about 15 years ago. Her smoking use included cigarettes. She started smoking about 20 years ago. She has never used smokeless tobacco.  Recent Labs    08/24/23 1118  HGBA1C 5.6    Objective:  VS:  HT:    WT:   BMI:     BP:   HR: bpm  TEMP: ( )  RESP:  Physical Exam Vitals and nursing note reviewed.  Constitutional:      General: She is not in acute distress.    Appearance: Normal appearance. She is well-developed. She is not ill-appearing.  HENT:     Head: Normocephalic and atraumatic.  Eyes:     Conjunctiva/sclera: Conjunctivae normal.     Pupils: Pupils are equal, round, and reactive to light.  Cardiovascular:     Rate and Rhythm: Normal rate.     Pulses: Normal pulses.  Pulmonary:     Effort: Pulmonary effort is normal.  Musculoskeletal:        General: Tenderness present. No swelling or deformity.     Right lower leg: No edema.     Left lower leg: No edema.     Comments: Inspection reveals no atrophy of the bilateral APB or FDI or hand intrinsics. There is no swelling, color changes, allodynia or dystrophic changes. There is 5 out of 5 strength  in the bilateral wrist extension, finger abduction and long finger flexion. There is intact sensation to light touch in all dermatomal and peripheral nerve distributions. There is a negative Froment's test bilaterally. There is a negative Tinel's test at the bilateral wrist and elbow. There is a positive Phalen's test on the right. There is a negative Hoffmann's test bilaterally.  Skin:    General: Skin is warm and dry.     Findings: No erythema or rash.  Neurological:     General: No focal deficit present.     Mental Status: She is alert and oriented to person, place, and time.     Cranial Nerves: No cranial nerve deficit.     Sensory: No sensory deficit.     Motor: No weakness or abnormal muscle tone.     Coordination: Coordination normal.     Gait: Gait normal.  Psychiatric:        Mood and Affect: Mood normal.        Behavior: Behavior normal.     Ortho Exam  Imaging: No results found.  Past Medical/Family/Surgical/Social History: Medications & Allergies reviewed per EMR, new medications updated. Patient Active Problem List   Diagnosis Date Noted   Mild obstructive sleep apnea 07/12/2023   Poor compliance with CPAP treatment 07/12/2023   Somatic dysfunction of  spine, sacral 08/03/2022   Joint hyperextensibility of multiple sites 07/12/2022   Gastroparesis 07/12/2022   Non-restorative sleep 04/27/2022   Snoring 04/27/2022   Sleep walking 04/27/2022   Excessive daytime sleepiness 04/11/2022   Chronic fatigue 04/11/2022   Carpal tunnel syndrome, right 01/18/2021   Biceps tendinitis of right upper extremity 01/18/2021   Anal fissure 09/24/2018   Anal pain 09/24/2018   Rectal bleeding 09/24/2018   Frequent headaches 07/31/2018   GERD (gastroesophageal reflux disease) 07/31/2018   High blood pressure 07/31/2018   Migraines 07/31/2018   Urine incontinence 07/31/2018   Urinary tract infection 07/31/2018   History reviewed. No pertinent past medical history. Family History   Problem Relation Age of Onset   Alcohol abuse Father    Cancer Father    Alcohol abuse Brother    Cancer Maternal Grandmother    Diabetes Maternal Grandmother    High blood pressure Maternal Grandmother    Alcohol abuse Maternal Grandfather    Heart disease Maternal Grandfather    High Cholesterol Maternal Grandfather    High blood pressure Maternal Grandfather    Alcohol abuse Paternal Grandmother    Alcohol abuse Paternal Grandfather    Cancer Paternal Grandfather    Colon cancer Neg Hx    Esophageal cancer Neg Hx    Inflammatory bowel disease Neg Hx    Liver disease Neg Hx    Pancreatic cancer Neg Hx    Rectal cancer Neg Hx    Stomach cancer Neg Hx    History reviewed. No pertinent surgical history. Social History   Occupational History   Not on file  Tobacco Use   Smoking status: Former    Current packs/day: 0.00    Types: Cigarettes    Start date: 05/19/2003    Quit date: 05/18/2008    Years since quitting: 15.6   Smokeless tobacco: Never   Tobacco comments:    Social smoker;   Vaping Use   Vaping status: Never Used  Substance and Sexual Activity   Alcohol use: Yes   Drug use: Never   Sexual activity: Not on file

## 2024-01-09 ENCOUNTER — Ambulatory Visit: Payer: BC Managed Care – PPO | Admitting: Orthopedic Surgery

## 2024-01-10 ENCOUNTER — Ambulatory Visit: Payer: 59 | Admitting: Orthopedic Surgery

## 2024-01-10 ENCOUNTER — Encounter: Payer: 59 | Admitting: Rehabilitative and Restorative Service Providers"

## 2024-01-10 DIAGNOSIS — G5601 Carpal tunnel syndrome, right upper limb: Secondary | ICD-10-CM | POA: Diagnosis not present

## 2024-01-11 NOTE — Progress Notes (Signed)
Margaret Rich - 43 y.o. female MRN [id number]  Date of birth: 09-09-1981  Office Visit Note: Visit Date: 01/10/2024 PCP: Olive Bass, FNP Referred by: Olive Bass,*  Subjective: No chief complaint on file.  HPI: Margaret Rich is a pleasant 43 y.o. female who presents today for follow-up regarding right hand numbness and tingling, both radial and ulnar sided digits.  She states that she has been paying more attention to the specific type of numbness recently, feels that the numbness and tingling is more on the radial aspect of the hand, spares the small finger.   She had been evaluated for carpal tunnel in the past, has undergone prior injections per sports medicine.  She states that the injections were initially helping, she estimates 3 total injections, the most recent one with minimal relief of symptoms approximately 1 year prior.  She has a recent electrodiagnostic study which confirms right-sided carpal tunnel syndrome.  She was initially sent for electrodiagnostic study in order to rule out any ulnar nerve pathology.  Pertinent ROS were reviewed with the patient and found to be negative unless otherwise specified above in HPI.   Assessment & Plan: Visit Diagnoses:  No diagnosis found.   Plan: Extensive discussion was had with the patient today about her ongoing right sided carpal tunnel syndrome that is refractory to conservative care.  Patient has both clinical and electrodiagnostic evidence to confirm this diagnosis.  At this juncture, she is indicated for right open versus endoscopic carpal tunnel release.  Risks and benefits of both operations were discussed in detail today as well as forms of anesthesia.  Understanding all risks and benefits, patient would like to have surgery done in the form of right open carpal tunnel release under local anesthesia.  Risks include but not limited to infection, bleeding, scarring, stiffness, nerve injury or  vascular, tendon injury, risk of recurrence and need for subsequent operation were all discussed in detail.  Patient consented understanding the above.  Will move forward surgical scheduling.  We discussed her clinical examination which does have some ulnar nerve pathology, however she has appropriate nerve conduction on the electrodiagnostic study recently obtained.  She expressed understanding, we will continue with conservative management of these findings for the time being.   Follow-up: No follow-ups on file.   Meds & Orders: No orders of the defined types were placed in this encounter.   No orders of the defined types were placed in this encounter.    Procedures: No procedures performed      Clinical History: No specialty comments available.  She reports that she quit smoking about 15 years ago. Her smoking use included cigarettes. She started smoking about 20 years ago. She has never used smokeless tobacco.  Recent Labs    08/24/23 1118  HGBA1C 5.6    Objective:   Vital Signs: There were no vitals taken for this visit.  Physical Exam  Gen: Well-appearing, in no acute distress; non-toxic CV: Regular Rate. Well-perfused. Warm.  Resp: Breathing unlabored on room air; no wheezing. Psych: Fluid speech in conversation; appropriate affect; normal thought process  Ortho Exam PHYSICAL EXAM:  General: Patient is well appearing and in no distress. Cervical spine mobility is full in all directions:  Skin and Muscle: No significant skin changes are apparent to upper extremities.   Range of Motion and Palpation Tests: Mobility is full about the elbows with flexion and extension.  No evidence of nerve subluxation at right elbow.  Forearm  supination and pronation are 85/85 bilaterally.  Wrist flexion/extension is 75/65 bilaterally.  Digital flexion and extension are full.  Thumb opposition is full to the base of the small fingers bilaterally.    No cords or nodules are palpated.   No triggering is observed.     Neurologic, Vascular, Motor: Sensation is diminished to light touch in the right median and ulnar distribution.   Tinel sign cubital tunnel: Positive right Tinel sign carpal tunnel: Positive right  Sensory right side Slightly diminished to light touch in both median and ulnar nerve distributions  Motor right side APB 5/5 without thenar atrophy Able to perform thumb opposition SF FDP: 5/5 Froment: Negative Wartenberg: Negative FDI wasting: Negative  Fingers pink and well perfused.  Capillary refill is brisk.     Lab Results  Component Value Date   HGBA1C 5.6 08/24/2023     Imaging: No results found.   Past Medical/Family/Surgical/Social History: Medications & Allergies reviewed per EMR, new medications updated. Patient Active Problem List   Diagnosis Date Noted   Mild obstructive sleep apnea 07/12/2023   Poor compliance with CPAP treatment 07/12/2023   Somatic dysfunction of spine, sacral 08/03/2022   Joint hyperextensibility of multiple sites 07/12/2022   Gastroparesis 07/12/2022   Non-restorative sleep 04/27/2022   Snoring 04/27/2022   Sleep walking 04/27/2022   Excessive daytime sleepiness 04/11/2022   Chronic fatigue 04/11/2022   Carpal tunnel syndrome, right 01/18/2021   Biceps tendinitis of right upper extremity 01/18/2021   Anal fissure 09/24/2018   Anal pain 09/24/2018   Rectal bleeding 09/24/2018   Frequent headaches 07/31/2018   GERD (gastroesophageal reflux disease) 07/31/2018   High blood pressure 07/31/2018   Migraines 07/31/2018   Urine incontinence 07/31/2018   Urinary tract infection 07/31/2018   No past medical history on file. Family History  Problem Relation Age of Onset   Alcohol abuse Father    Cancer Father    Alcohol abuse Brother    Cancer Maternal Grandmother    Diabetes Maternal Grandmother    High blood pressure Maternal Grandmother    Alcohol abuse Maternal Grandfather    Heart disease  Maternal Grandfather    High Cholesterol Maternal Grandfather    High blood pressure Maternal Grandfather    Alcohol abuse Paternal Grandmother    Alcohol abuse Paternal Grandfather    Cancer Paternal Grandfather    Colon cancer Neg Hx    Esophageal cancer Neg Hx    Inflammatory bowel disease Neg Hx    Liver disease Neg Hx    Pancreatic cancer Neg Hx    Rectal cancer Neg Hx    Stomach cancer Neg Hx    No past surgical history on file. Social History   Occupational History   Not on file  Tobacco Use   Smoking status: Former    Current packs/day: 0.00    Types: Cigarettes    Start date: 05/19/2003    Quit date: 05/18/2008    Years since quitting: 15.6   Smokeless tobacco: Never   Tobacco comments:    Social smoker;   Vaping Use   Vaping status: Never Used  Substance and Sexual Activity   Alcohol use: Yes   Drug use: Never   Sexual activity: Not on file    Pearse Shiffler Fara Boros) Denese Killings, M.D. [facility]

## 2024-01-17 ENCOUNTER — Other Ambulatory Visit: Payer: Self-pay | Admitting: Orthopedic Surgery

## 2024-01-17 DIAGNOSIS — G5601 Carpal tunnel syndrome, right upper limb: Secondary | ICD-10-CM | POA: Diagnosis not present

## 2024-01-17 MED ORDER — ACETAMINOPHEN-CODEINE 300-30 MG PO TABS: 1.0000 | ORAL_TABLET | Freq: Four times a day (QID) | ORAL | 0 refills | Status: DC | PRN

## 2024-01-25 ENCOUNTER — Encounter: Payer: Self-pay | Admitting: Family

## 2024-01-25 ENCOUNTER — Ambulatory Visit (INDEPENDENT_AMBULATORY_CARE_PROVIDER_SITE_OTHER): Payer: 59 | Admitting: Family

## 2024-01-25 VITALS — BP 138/86 | HR 95 | Temp 99.3°F | Ht 67.0 in | Wt 194.4 lb

## 2024-01-25 DIAGNOSIS — R000 Tachycardia, unspecified: Secondary | ICD-10-CM | POA: Diagnosis not present

## 2024-01-25 DIAGNOSIS — J209 Acute bronchitis, unspecified: Secondary | ICD-10-CM | POA: Diagnosis not present

## 2024-01-25 DIAGNOSIS — N3010 Interstitial cystitis (chronic) without hematuria: Secondary | ICD-10-CM | POA: Diagnosis not present

## 2024-01-25 MED ORDER — AZITHROMYCIN 250 MG PO TABS: ORAL_TABLET | ORAL | 0 refills | Status: DC

## 2024-01-25 MED ORDER — FLUTICASONE FUROATE-VILANTEROL 200-25 MCG/ACT IN AEPB: 1.0000 | INHALATION_SPRAY | Freq: Every day | RESPIRATORY_TRACT | 0 refills | Status: DC

## 2024-01-25 NOTE — Progress Notes (Signed)
 Margaret Rich is a 43 y.o. female with the following history as recorded in [employer]:  Patient Active Problem List   Diagnosis Date Noted   Mild obstructive sleep apnea 07/12/2023   Poor compliance with CPAP treatment 07/12/2023   Somatic dysfunction of spine, sacral 08/03/2022   Joint hyperextensibility of multiple sites 07/12/2022   Gastroparesis 07/12/2022   Non-restorative sleep 04/27/2022   Snoring 04/27/2022   Sleep walking 04/27/2022   Excessive daytime sleepiness 04/11/2022   Chronic fatigue 04/11/2022   Carpal tunnel syndrome, right 01/18/2021   Biceps tendinitis of right upper extremity 01/18/2021   Anal fissure 09/24/2018   Anal pain 09/24/2018   Rectal bleeding 09/24/2018   Frequent headaches 07/31/2018   GERD (gastroesophageal reflux disease) 07/31/2018   High blood pressure 07/31/2018   Migraines 07/31/2018   Urine incontinence 07/31/2018   Urinary tract infection 07/31/2018    Current Outpatient Medications  Medication Sig Dispense Refill   amitriptyline (ELAVIL) 25 MG tablet Take 1 tablet by mouth daily.     Ascorbic Acid (VITAMIN C WITH ROSE HIPS) 500 MG tablet Take 500 mg by mouth daily.     azithromycin  (ZITHROMAX  Z-PAK) 250 MG tablet Take 2 tablets (500 mg) PO today, then 1 tablet (250 mg) PO daily x4 days. 6 tablet 0   buPROPion  (WELLBUTRIN  XL) 150 MG 24 hr tablet Take 1 tablet (150 mg total) by mouth daily. 90 tablet 3   fluticasone  furoate-vilanterol (BREO ELLIPTA ) 200-25 MCG/ACT AEPB Inhale 1 puff into the lungs daily. 1 each 0   magnesium gluconate (MAGONATE) 500 MG tablet Take 500 mg by mouth 2 (two) times daily.     multivitamin-lutein (OCUVITE-LUTEIN) CAPS capsule Take 1 capsule by mouth daily.     Omega-3 Fatty Acids (FISH OIL) 300 MG CAPS Take by mouth.     ondansetron  (ZOFRAN ) 4 MG tablet Take 1 tablet (4 mg total) by mouth every 8 (eight) hours as needed for nausea or vomiting. 20 tablet 0   OVER THE COUNTER MEDICATION Take 1 tablet by mouth 2  (two) times daily. Migrelief + M     SUMAtriptan  (IMITREX ) 25 MG tablet Take 1 tablet (25 mg total) by mouth every 2 (two) hours as needed for migraine. TAKE 1 TABLET BY MOUTH EVERY 2 HOURS FOR MIGRAINE. MAY REPEAT IN 2 HOURS IF HEADACHE PERSISTS OR RECURS. 10 tablet 2   No current facility-administered medications for this visit.    Allergies: Latex  No past medical history on file.  No past surgical history on file.  Family History  Problem Relation Age of Onset   Alcohol abuse Father    Cancer Father    Alcohol abuse Brother    Cancer Maternal Grandmother    Diabetes Maternal Grandmother    High blood pressure Maternal Grandmother    Alcohol abuse Maternal Grandfather    Heart disease Maternal Grandfather    High Cholesterol Maternal Grandfather    High blood pressure Maternal Grandfather    Alcohol abuse Paternal Grandmother    Alcohol abuse Paternal Grandfather    Cancer Paternal Grandfather    Colon cancer Neg Hx    Esophageal cancer Neg Hx    Inflammatory bowel disease Neg Hx    Liver disease Neg Hx    Pancreatic cancer Neg Hx    Rectal cancer Neg Hx    Stomach cancer Neg Hx     Social History   Tobacco Use   Smoking status: Former    Current packs/day: 0.00  Types: Cigarettes    Start date: 05/19/2003    Quit date: 05/18/2008    Years since quitting: 15.6   Smokeless tobacco: Never   Tobacco comments:    Social smoker;   Substance Use Topics   Alcohol use: Yes    Subjective:   1 week history of cough/ congestion; recurrent fever; no chest pain, no shortness of breath; + barking cough; not worse at night; using OTC Dayquil/ Nyquil;   Worried that her Amitriptyline could be contributing to elevated heart rate; symptoms started after she began that medication; resting heart rate stays between 85-95; using for interstitial cystitis- would be interested in discussing other options;   Objective:  Vitals:   01/25/24 1511  BP: 138/86  Pulse: 95  Temp: 99.3 F  (37.4 C)  TempSrc: Oral  SpO2: 98%  Weight: 194 lb 6.4 oz (88.2 kg)  Height: 5' 7 (1.702 m)    General: Well developed, well nourished, in no acute distress  Skin : Warm and dry.  Head: Normocephalic and atraumatic  Eyes: Sclera and conjunctiva clear; pupils round and reactive to light; extraocular movements intact  Ears: External normal; canals clear; tympanic membranes normal  Oropharynx: Pink, supple. No suspicious lesions  Neck: Supple without thyromegaly, adenopathy  Lungs: Respirations unlabored; clear to auscultation bilaterally without wheeze, rales, rhonchi  CVS exam: normal rate and regular rhythm.  Neurologic: Alert and oriented; speech intact; face symmetrical; moves all extremities well; CNII-XII intact without focal deficit   Assessment:  1. Acute bronchitis, unspecified organism   2. Interstitial cystitis   3. Elevated pulse rate     Plan:  Rx for Z-pak #1 take as directed; Rx for BREO to help with bronchospasm; increase fluids, rest; Will have patient meet with urogyn to discuss other treatment options; ? If symptoms caused by amytriptyline; If decrease dosage or stopping amitriptyline does not improve heart rate, she will follow up with cardiology;   No follow-ups on file.  Orders Placed This Encounter  Procedures   Ambulatory referral to Cardiology    Referral Priority:   Routine    Referral Type:   Consultation    Referral Reason:   Specialty Services Required    Number of Visits Requested:   1   Ambulatory referral to Urogynecology    Referral Priority:   Routine    Referral Type:   Consultation    Referral Reason:   Specialty Services Required    Requested Specialty:   Urology    Number of Visits Requested:   1    Requested Prescriptions   Signed Prescriptions Disp Refills   azithromycin  (ZITHROMAX  Z-PAK) 250 MG tablet 6 tablet 0    Sig: Take 2 tablets (500 mg) PO today, then 1 tablet (250 mg) PO daily x4 days.   fluticasone  furoate-vilanterol  (BREO ELLIPTA ) 200-25 MCG/ACT AEPB 1 each 0    Sig: Inhale 1 puff into the lungs daily.

## 2024-01-27 ENCOUNTER — Encounter: Payer: Self-pay | Admitting: Orthopedic Surgery

## 2024-01-31 ENCOUNTER — Ambulatory Visit (INDEPENDENT_AMBULATORY_CARE_PROVIDER_SITE_OTHER): Payer: 59 | Admitting: Orthopedic Surgery

## 2024-01-31 DIAGNOSIS — G5601 Carpal tunnel syndrome, right upper limb: Secondary | ICD-10-CM

## 2024-01-31 DIAGNOSIS — Z9889 Other specified postprocedural states: Secondary | ICD-10-CM

## 2024-01-31 NOTE — Progress Notes (Signed)
   Margaret Rich - 43 y.o. female MRN [id number]  Date of birth: 08/30/1981  Office Visit Note: Visit Date: 01/31/2024 PCP: Olive Bass, FNP Referred by: Olive Bass,*  Subjective:  HPI: Margaret Rich is a 43 y.o. female who presents today for follow up 2 weeks status post right wrist open carpal tunnel release.  Her numbness and tingling is improving.  She does have some slight erythema around the wound site without evidence of drainage.  Sutures are intact.  Denies any fevers or chills.  Pertinent ROS were reviewed with the patient and found to be negative unless otherwise specified above in HPI.   Assessment & Plan: Visit Diagnoses: No diagnosis found.  Plan: She is doing appropriately postoperatively, she does have some mild erythema around the incisional site, appears to be more of a suture reaction.  No evidence of drainage or collection on examination today.  Sutures were removed without incident, Steri-Strips applied.  Will begin occupational therapy postoperative protocol for range of motion and progression of strengthening as tolerated.  Activities as tolerated.  Follow-up in approximate 4 weeks, I have mentioned that she should return sooner should she notice any further wound issues.  Follow-up: No follow-ups on file.   Meds & Orders: No orders of the defined types were placed in this encounter.  No orders of the defined types were placed in this encounter.    Procedures: No procedures performed       Objective:   Vital Signs: There were no vitals taken for this visit.  Ortho Exam Right hand: - Well-healing palmar incision, sutures in place, skin edges well-approximated, slight erythema around the wound site without drainage, mild warmth - Composite fist without restriction - Sensation intact to light touch in the median nerve distribution - 5/5 APB without thenar atrophy   Imaging: No results found.   Dejon Jungman Trevor Mace,  M.D. [facility] 8:20 AM

## 2024-02-14 ENCOUNTER — Encounter: Payer: Self-pay | Admitting: Family

## 2024-02-14 ENCOUNTER — Other Ambulatory Visit: Payer: Self-pay | Admitting: Family

## 2024-02-14 DIAGNOSIS — Z6830 Body mass index (BMI) 30.0-30.9, adult: Secondary | ICD-10-CM

## 2024-02-14 DIAGNOSIS — G4733 Obstructive sleep apnea (adult) (pediatric): Secondary | ICD-10-CM

## 2024-02-19 ENCOUNTER — Encounter: Admitting: Bariatrics

## 2024-02-21 ENCOUNTER — Encounter: Admitting: Bariatrics

## 2024-02-25 DIAGNOSIS — Z0289 Encounter for other administrative examinations: Secondary | ICD-10-CM

## 2024-02-26 ENCOUNTER — Ambulatory Visit: Admitting: Nurse Practitioner

## 2024-02-26 ENCOUNTER — Encounter: Payer: Self-pay | Admitting: Nurse Practitioner

## 2024-02-26 VITALS — BP 120/80 | HR 98 | Temp 98.7°F | Ht 66.0 in | Wt 193.0 lb

## 2024-02-26 DIAGNOSIS — Z6831 Body mass index (BMI) 31.0-31.9, adult: Secondary | ICD-10-CM | POA: Diagnosis not present

## 2024-02-26 DIAGNOSIS — E66811 Obesity, class 1: Secondary | ICD-10-CM | POA: Diagnosis not present

## 2024-02-26 DIAGNOSIS — G4733 Obstructive sleep apnea (adult) (pediatric): Secondary | ICD-10-CM

## 2024-02-26 DIAGNOSIS — E559 Vitamin D deficiency, unspecified: Secondary | ICD-10-CM

## 2024-02-26 NOTE — Progress Notes (Signed)
 Office: [phone]  /  Fax: [phone]   Initial Visit  Margaret Rich was seen in clinic today to evaluate for obesity. She is interested in losing weight to improve overall health and reduce the risk of weight related complications. She presents today to review program treatment options, initial physical assessment, and evaluation.     She was referred by: PCP  When asked what else they would like to accomplish? She states: Adopt healthier eating patterns, Improve quality of life, Improve appearance, and Improve self-confidence  Her goal is to lose weight to help with OSAS and tachycardia. She hasn't been able to tolerated wearing CPAP due to increased migraines with CPAP use.    When asked how has your weight affected you? She states: Contributed to orthopedic problems or mobility issues, Having fatigue, and Having poor endurance  Some associated conditions: OSAS (not on CPAP due to increased with migraines while wearing CPAP), HTN, migraines, gastroparesis, GERD, CTS, Vit D def  Contributing factors: Family history of obesity, Use of obesogenic medications: Contraceptives or hormonal therapy, and Slow metabolism for age  Weight promoting medications identified: Contraceptives or hormonal therapy  Current nutrition plan: None  Current level of physical activity: Yoga once per week-met with a fitness instructor yesterday and was given a work out plan  Current or previous pharmacotherapy: None  Response to medication: Never tried medications   Past medical history includes:  History reviewed. No pertinent past medical history.   Objective:   BP 120/80   Pulse 98   Temp 98.7 F (37.1 C)   Ht 5\' 6"  (1.676 m)   Wt 193 lb (87.5 kg)   SpO2 98%   BMI 31.15 kg/m  She was weighed on the bioimpedance scale: Body mass index is 31.15 kg/m.  Peak Weight:196 lbs , Body Fat%:38.4, Visceral Fat Rating:8, Weight trend over the last 12 months: Decreasing  General:  Alert,  oriented and cooperative. Patient is in no acute distress.  Respiratory: Normal respiratory effort, no problems with respiration noted   Gait: able to ambulate independently  Mental Status: Normal mood and affect. Normal behavior. Normal judgment and thought content.   DIAGNOSTIC DATA REVIEWED:  BMET    Component Value Date/Time   NA 139 08/24/2023 1118   K 4.2 08/24/2023 1118   CL 106 08/24/2023 1118   CO2 24 08/24/2023 1118   GLUCOSE 97 08/24/2023 1118   BUN 12 08/24/2023 1118   CREATININE 0.82 08/24/2023 1118   CALCIUM 9.5 08/24/2023 1118   Lab Results  Component Value Date   HGBA1C 5.6 08/24/2023   HGBA1C 5.5 05/11/2020   No results found for: "INSULIN" CBC    Component Value Date/Time   WBC 10.5 08/24/2023 1118   RBC 4.57 08/24/2023 1118   HGB 12.9 08/24/2023 1118   HCT 40.1 08/24/2023 1118   PLT 253.0 08/24/2023 1118   MCV 87.9 08/24/2023 1118   MCHC 32.2 08/24/2023 1118   RDW 14.8 08/24/2023 1118   Iron/TIBC/Ferritin/ %Sat No results found for: "IRON", "TIBC", "FERRITIN", "IRONPCTSAT" Lipid Panel  No results found for: "CHOL", "TRIG", "HDL", "CHOLHDL", "VLDL", "LDLCALC", "LDLDIRECT" Hepatic Function Panel     Component Value Date/Time   PROT 7.0 08/24/2023 1118   ALBUMIN 4.2 08/24/2023 1118   AST 15 08/24/2023 1118   ALT 15 08/24/2023 1118   ALKPHOS 47 08/24/2023 1118   BILITOT 0.4 08/24/2023 1118      Component Value Date/Time   TSH 2.68 08/24/2023 1118     Assessment  and Plan:   Mild obstructive sleep apnea Not currently on CPAP  Vitamin D deficiency Will obtain labs at next visit.  Will continue to monitor  Class 1 obesity due to excess calories without serious comorbidity with body mass index (BMI) of 31.0 to 31.9 in adult        Obesity Treatment / Action Plan:  Patient will work on garnering support from family and friends to begin weight loss journey. Will work on eliminating or reducing the presence of highly palatable, calorie  dense foods in the home. Will complete provided nutritional and psychosocial assessment questionnaire before the next appointment. Will be scheduled for indirect calorimetry to determine resting energy expenditure in a fasting state.  This will allow [place] to create a reduced calorie, high-protein meal plan to promote loss of fat mass while preserving muscle mass. Counseled on the health benefits of losing 5%-15% of total body weight. Was counseled on nutritional approaches to weight loss and benefits of reducing processed foods and consuming plant-based foods and high quality protein as part of nutritional weight management. Was counseled on pharmacotherapy and role as an adjunct in weight management.   Obesity Education Performed Today:  She was weighed on the bioimpedance scale and results were discussed and documented in the synopsis.  We discussed obesity as a disease and the importance of a more detailed evaluation of all the factors contributing to the disease.  We discussed the importance of long term lifestyle changes which include nutrition, exercise and behavioral modifications as well as the importance of customizing this to her specific health and social needs.  We discussed the benefits of reaching a healthier weight to alleviate the symptoms of existing conditions and reduce the risks of the biomechanical, metabolic and psychological effects of obesity.  Margaret Fillers Meuth appears to be in the action stage of change and states they are ready to start intensive lifestyle modifications and behavioral modifications.  30 minutes was spent today on this visit including the above counseling, pre-visit chart review, and post-visit documentation.  Reviewed by clinician on day of visit: allergies, medications, problem list, medical history, surgical history, family history, social history, and previous encounter notes pertinent to obesity diagnosis.    Theodis Sato Justino Boze FNP-C

## 2024-03-04 ENCOUNTER — Encounter: Admitting: Bariatrics

## 2024-03-06 ENCOUNTER — Encounter: Payer: Self-pay | Admitting: Bariatrics

## 2024-03-06 ENCOUNTER — Ambulatory Visit: Admitting: Bariatrics

## 2024-03-06 VITALS — BP 126/84 | HR 93 | Temp 98.1°F | Ht 66.0 in | Wt 191.0 lb

## 2024-03-06 DIAGNOSIS — E669 Obesity, unspecified: Secondary | ICD-10-CM

## 2024-03-06 DIAGNOSIS — G43109 Migraine with aura, not intractable, without status migrainosus: Secondary | ICD-10-CM | POA: Diagnosis not present

## 2024-03-06 DIAGNOSIS — R5383 Other fatigue: Secondary | ICD-10-CM | POA: Diagnosis not present

## 2024-03-06 DIAGNOSIS — R7309 Other abnormal glucose: Secondary | ICD-10-CM | POA: Diagnosis not present

## 2024-03-06 DIAGNOSIS — Z6830 Body mass index (BMI) 30.0-30.9, adult: Secondary | ICD-10-CM

## 2024-03-06 DIAGNOSIS — G4733 Obstructive sleep apnea (adult) (pediatric): Secondary | ICD-10-CM | POA: Diagnosis not present

## 2024-03-06 DIAGNOSIS — R0602 Shortness of breath: Secondary | ICD-10-CM | POA: Diagnosis not present

## 2024-03-06 DIAGNOSIS — E559 Vitamin D deficiency, unspecified: Secondary | ICD-10-CM

## 2024-03-06 DIAGNOSIS — Z1331 Encounter for screening for depression: Secondary | ICD-10-CM | POA: Diagnosis not present

## 2024-03-06 DIAGNOSIS — Z0000 Encounter for general adult medical examination without abnormal findings: Secondary | ICD-10-CM

## 2024-03-06 DIAGNOSIS — E66811 Obesity, class 1: Secondary | ICD-10-CM

## 2024-03-06 NOTE — Progress Notes (Signed)
 At a Glance:  Vitals Temp: 98.1 F (36.7 C) BP: 126/84 Pulse Rate: 93 SpO2: 97 %   Anthropometric Measurements Height: 5\' 6"  (1.676 m) Weight: 191 lb (86.6 kg) BMI (Calculated): 30.84 Starting Weight: 191lb Peak Weight: 196lb Waist Measurement : 37 inches   Body Composition  Body Fat %: 38.8 % Fat Mass (lbs): 74 lbs Muscle Mass (lbs): 111.8 lbs Total Body Water (lbs): 82.6 lbs Visceral Fat Rating : 8   Other Clinical Data RMR: 2088 Fasting: Yes Labs: Yes Today's Visit #: 1 Starting Date: 03/06/24    EKG: Normal sinus rhythm, rate 100.  Indirect Calorimeter:   Resting Metabolic Rate ( RMR):  RMR (actual): 2088 kcal RMR (calculated): 1621 kcal The calculated basal metabolic rate is 1610 kcal thus her basal metabolic rate is better than expected.  Plan:   Indirect calorimeter completed, interpreted and reviewed with patient today and allowed to ask questions.  Discussed the implications for the chosen plan and exercise based on the RMR reading.  Will consider repeating the RMR in the future based on weight loss.    Chief Complaint:  Obesity   Subjective:  Margaret Rich (MR# [id number]) is a 43 y.o. female who presents for evaluation and treatment of obesity and related comorbidities.   Margaret Rich is currently in the action stage of change and ready to dedicate time achieving and maintaining a healthier weight. Margaret Rich is interested in becoming our patient and working on intensive lifestyle modifications including (but not limited to) diet and exercise for weight loss.  Margaret Rich has been struggling with her weight. She has been unsuccessful in either losing weight, maintaining weight loss, or reaching her healthy weight goal.  Margaret Rich's habits were reviewed today and are as follows: Her family eats meals together, she thinks her family will eat healthier with her, and she started gaining weight during the Covid 19 pandemic. .  Current or previous  pharmacotherapy: None  Response to medication: Never tried medications  Other Fatigue Margaret Rich admits to daytime somnolence and admits to waking up still tired. Patient has a history of symptoms of daytime fatigue. Margaret Rich generally gets 6 or 7 hours of sleep per night, and states that she has difficulty falling back asleep if awakened. Snoring is present. Apneic episodes is present. Epworth Sleepiness Score is 16.   Shortness of Breath Margaret Rich notes increasing shortness of breath with exercising and seems to be worsening over time with weight gain. She notes getting out of breath sooner with activity than she used to. This has not gotten worse recently. Margaret Rich denies shortness of breath at rest or orthopnea.  Depression Screen Margaret Rich's Food and Mood (modified PHQ-9) score was 18. 15-19 moderate severe depression     08/24/2023   10:48 AM  Depression screen PHQ 2/9  Decreased Interest 0  Down, Depressed, Hopeless 0  PHQ - 2 Score 0     Assessment and Plan:   Other Fatigue Margaret Rich does not know feel that her weight is causing her energy to be lower than it should be. Fatigue may be related to obesity, depression or many other causes. Labs will be ordered, and in the meanwhile, Margaret Rich will focus on self care including making healthy food choices, increasing physical activity and focusing on stress reduction.  Shortness of Breath [name] does not feel that she gets out of breath more easily that she used to when she exercises. Margaret Rich's shortness of breath appears to be obesity related and exercise induced. She has agreed to  work on weight loss and gradually increase exercise to treat her exercise induced shortness of breath. Will continue to monitor closely.  Health Maintenance:   Obesity   Plan: Will do EKG, indirect calorimetry, and labs.     Vitamin D Deficiency Vitamin D is not at goal of 50.  Most recent vitamin D level was 29.94. She is at risk for vitamin D deficiency due to obesity.   She is on MV. Lab Results  Component Value Date   VD25OH 29.94 (L) 05/11/2020    Plan: Will check for vitamin D deficiency.   Margaret Rich had a positive depression screening. Depression is commonly associated with obesity and often results in emotional eating behaviors. We will monitor this closely and work on CBT to help improve the non-hunger eating patterns. Referral to Psychology may be required if no improvement is seen as she continues in our clinic.   Obstructive Sleep Apnea (mild):  Margaret Rich has a diagnosis of sleep apnea. She reports that she is not using a CPAP regularly. Reports restful sleep.   Plan: Will work on her diet and exercise.  Continue to practice good sleep hygiene.  Will add in elements of an antiinflammatory diet and  add in elements of a Mediterranean diet.  Reduce intake of carbohydrates for weight loss.   Migraines with aura.   She was diagnosed with a history of migraines  and is well controlled.   She is currently taking the following medications Imitrex She is not seeing a neurologist.    Plan:  Discussed the benefit that losing weight may on the frequency and intensity of migraines.  Will continue medications.  Will avoid trigger substances and food.  Will practice good sleep hygiene.  Discussed the importance of exercise.  Discussed the vitamin D supplementation may reduce migraine status.    Elevated glucose:   Would like to rule out any prediabetes or diabetes.   Plan: will check an A1c and insulin level.  Labs done today CMP, Lipids, Insulin, HgbA1c, and Vit D   Generalized Obesity: BMI (Calculated): 30.84   Margaret Rich is currently in the action stage of change and her goal is to begin weight loss efforts. I recommend Margaret Rich begin the structured treatment plan as follows:  She has agreed to Category 3 Plan  Exercise goals: All adults should avoid inactivity. Some activity is better than none, and adults who participate in any amount of  physical activity, gain some health benefits.  Behavioral modification strategies:increasing lean protein intake, decreasing simple carbohydrates, increasing vegetables, increase H2O intake, increase high fiber foods, no skipping meals, meal planning and cooking strategies, keeping healthy foods in the home, and better snacking choices  She was informed of the importance of frequent follow-up visits to maximize her success with intensive lifestyle modifications for her multiple health conditions. She was informed we would discuss her lab results at her next visit unless there is a critical issue that needs to be addressed sooner. Ashlei agreed to keep her next visit at the agreed upon time to discuss these results.  Objective:  General: Cooperative, alert, well developed, in no acute distress. HEENT: Conjunctivae and lids unremarkable. Cardiovascular: Regular rhythm.  Lungs: Normal work of breathing. Neurologic: No focal deficits.   Lab Results  Component Value Date   CREATININE 0.82 08/24/2023   BUN 12 08/24/2023   NA 139 08/24/2023   K 4.2 08/24/2023   CL 106 08/24/2023   CO2 24 08/24/2023   Lab Results  Component Value Date  ALT 15 08/24/2023   AST 15 08/24/2023   ALKPHOS 47 08/24/2023   BILITOT 0.4 08/24/2023   Lab Results  Component Value Date   HGBA1C 5.6 08/24/2023   HGBA1C 5.5 05/11/2020   No results found for: "INSULIN" Lab Results  Component Value Date   TSH 2.68 08/24/2023   No results found for: "CHOL", "HDL", "LDLCALC", "LDLDIRECT", "TRIG", "CHOLHDL" Lab Results  Component Value Date   WBC 10.5 08/24/2023   HGB 12.9 08/24/2023   HCT 40.1 08/24/2023   MCV 87.9 08/24/2023   PLT 253.0 08/24/2023   No results found for: "IRON", "TIBC", "FERRITIN"  Attestation Statements:  Applicable history such as the following:  allergies, medications, problem list, medical history, surgical history, family history, social history, and previous encounter notes reviewed  by clinician on day of visit:   Time spent on visit including the items listed below was 40 minutes.  -preparing to see the patient (e.g., review of tests, history, previous notes) -obtaining and/or reviewing separately obtained history -counseling and educating the patient/family/caregiver -documenting clinical information in the electronic or other health record -ordering medications, tests, or procedures -independently interpreting results and communicating results to the patient/ family/caregiver -referring and communicating with other health care professionals  -care coordination   This may have been prepared with the assistance of Engineer, civil (consulting).  Occasional wrong-word or sound-a-like substitutions may have occurred due to the inherent limitations of voice recognition software.    Corinna Capra, DO

## 2024-03-07 LAB — COMPREHENSIVE METABOLIC PANEL
ALT: 22 IU/L (ref 0–32)
AST: 21 IU/L (ref 0–40)
Albumin: 4.7 g/dL (ref 3.9–4.9)
Alkaline Phosphatase: 76 IU/L (ref 44–121)
BUN/Creatinine Ratio: 10 (ref 9–23)
BUN: 8 mg/dL (ref 6–24)
Bilirubin Total: 0.4 mg/dL (ref 0.0–1.2)
CO2: 21 mmol/L (ref 20–29)
Calcium: 9.4 mg/dL (ref 8.7–10.2)
Chloride: 102 mmol/L (ref 96–106)
Creatinine, Ser: 0.84 mg/dL (ref 0.57–1.00)
Globulin, Total: 2.6 g/dL (ref 1.5–4.5)
Glucose: 82 mg/dL (ref 70–99)
Potassium: 4.4 mmol/L (ref 3.5–5.2)
Sodium: 138 mmol/L (ref 134–144)
Total Protein: 7.3 g/dL (ref 6.0–8.5)
eGFR: 88 mL/min/{1.73_m2} (ref >59)

## 2024-03-07 LAB — HEMOGLOBIN A1C
Est. average glucose Bld gHb Est-mCnc: 114 mg/dL
Hgb A1c MFr Bld: 5.6 % (ref 4.8–5.6)

## 2024-03-07 LAB — LIPID PANEL WITH LDL/HDL RATIO
Cholesterol, Total: 219 mg/dL — ABNORMAL HIGH (ref 100–199)
HDL: 55 mg/dL (ref >39)
LDL Chol Calc (NIH): 137 mg/dL — ABNORMAL HIGH (ref 0–99)
LDL/HDL Ratio: 2.5 ratio (ref 0.0–3.2)
Triglycerides: 150 mg/dL — ABNORMAL HIGH (ref 0–149)
VLDL Cholesterol Cal: 27 mg/dL (ref 5–40)

## 2024-03-07 LAB — INSULIN, RANDOM: INSULIN: 21 u[IU]/mL (ref 2.6–24.9)

## 2024-03-07 LAB — VITAMIN D 25 HYDROXY (VIT D DEFICIENCY, FRACTURES): Vit D, 25-Hydroxy: 37.3 ng/mL (ref 30.0–100.0)

## 2024-03-10 ENCOUNTER — Encounter: Payer: Self-pay | Admitting: Bariatrics

## 2024-03-10 DIAGNOSIS — E785 Hyperlipidemia, unspecified: Secondary | ICD-10-CM | POA: Insufficient documentation

## 2024-03-18 ENCOUNTER — Ambulatory Visit: Admitting: Bariatrics

## 2024-03-18 ENCOUNTER — Encounter: Payer: Self-pay | Admitting: Bariatrics

## 2024-03-18 VITALS — BP 113/75 | HR 97 | Temp 98.2°F | Ht 66.0 in | Wt 186.0 lb

## 2024-03-18 DIAGNOSIS — Z6830 Body mass index (BMI) 30.0-30.9, adult: Secondary | ICD-10-CM

## 2024-03-18 DIAGNOSIS — E559 Vitamin D deficiency, unspecified: Secondary | ICD-10-CM | POA: Diagnosis not present

## 2024-03-18 DIAGNOSIS — E7800 Pure hypercholesterolemia, unspecified: Secondary | ICD-10-CM | POA: Diagnosis not present

## 2024-03-18 DIAGNOSIS — E88819 Insulin resistance, unspecified: Secondary | ICD-10-CM

## 2024-03-18 DIAGNOSIS — E669 Obesity, unspecified: Secondary | ICD-10-CM | POA: Diagnosis not present

## 2024-03-18 NOTE — Progress Notes (Signed)
 First follow-up after initial visit.        WEIGHT SUMMARY AND BIOMETRICS  Weight Lost Since Last Visit: 5lb  Weight Gained Since Last Visit: 0   Vitals Temp: 98.2 F (36.8 C) BP: 113/75 Pulse Rate: 97 SpO2: 100 %   Anthropometric Measurements Height: 5\' 6"  (1.676 m) Weight: 186 lb (84.4 kg) BMI (Calculated): 30.04 Weight at Last Visit: 191lb Weight Lost Since Last Visit: 5lb Weight Gained Since Last Visit: 0 Starting Weight: 191lb Total Weight Loss (lbs): 5 lb (2.268 kg) Peak Weight: 196lb   Body Composition  Body Fat %: 36.7 % Fat Mass (lbs): 68.4 lbs Muscle Mass (lbs): 112.2 lbs Total Body Water (lbs): 80.4 lbs Visceral Fat Rating : 8   Other Clinical Data Fasting: no Labs: no Today's Visit #: 2 Starting Date: 03/06/24    OBESITY Jamyah is here to discuss her progress with her obesity treatment plan along with follow-up of her obesity related diagnoses.    Nutrition Plan: the Category 3 plan - 0% adherence.  Current exercise:  Yoga classes  Interim History:  She is down 5 lbs since her last visit.  Eating all of the food on the plan., Protein intake is as prescribed, Journaling consistently., and Water intake is adequate.  Initial positives regarding the dietary plan: She did not follow the diet that closely but did follow the calories and protein.  Initial challenges regarding  the dietary plan: She is trying to figure how what time to eat in the evening as not to be hungry at night.   Hunger is moderately controlled.  Cravings are moderately controlled.  Assessment/Plan:   Insulin Resistance Peightyn has had elevated fasting insulin readings. Goal is HgbA1c < 5.7, fasting insulin at l0 or less, and preferably at 5.  She  denies polyphagia. Medication(s): none  Lab Results  Component Value Date   HGBA1C 5.6 03/06/2024   Lab  Results  Component Value Date   INSULIN 21.0 03/06/2024    Plan Will work on the agreed upon plan. Will minimize refined carbohydrates ( sweets and starches), and focus more on complex carbohydrates.  Increase the micronutrients found in leafy greens, which include magnesium, polyphenols, and vitamin C which have been postulated to help with insulin sensitivity. Minimize "fast food" and cook more meals at home.  Increase fiber to 25 to 30 grams daily.  Information sheet on " Insulin Resistance and Prediabetes".   Will have a protein shake or snack earlier in the evening and then eat her dinner a little later so that she does not get hungry at night.  Will continue to use my fitness pal. Discussed high-protein food choices and will increase her protein up to 90+ grams daily.  Vitamin D Insuffiencey:  Vitamin D is not at goal of 50.  Most recent vitamin D level was 37.3. She is not on vitamin D at this time, but has OTC vitamin D at home.  Lab Results  Component Value Date   VD25OH 37.3 03/06/2024   VD25OH 29.94 (L) 05/11/2020    Plan: Will begin vitamin D international units   Elevated cholesterol:  LDL is not at goal. Medication(s): fish oil  Cardiovascular risk factors: dyslipidemia, obesity (BMI >= 30 kg/m2), and sedentary lifestyle  Lab Results  Component Value Date   CHOL 219 (H) 03/06/2024   HDL 55 03/06/2024   LDLCALC 137 (H) 03/06/2024   TRIG 150 (H) 03/06/2024   Lab Results  Component Value Date   ALT 22  03/06/2024   AST 21 03/06/2024   ALKPHOS 76 03/06/2024   BILITOT 0.4 03/06/2024   The 10-year ASCVD risk score (Arnett DK, et al., 2019) is: 0.7%   Values used to calculate the score:     Age: 50 years     Sex: Female     Is Non-Hispanic African American: No     Diabetic: No     Tobacco smoker: No     Systolic Blood Pressure: 113 mmHg     Is BP treated: No     HDL Cholesterol: 55 mg/dL     Total Cholesterol: 219 mg/dL  Plan:  Information sheet on  healthy vs unhealthy fats.  Information sheet on high protein food choices.  Will avoid all trans fats.  Will read labels Will minimize saturated fats except the following: low fat meats in moderation, diary, and limited dark chocolate.     Labs reviewed today (CMP, Lipids, HgbA1c, insulin, vitamin D).    Generalized Obesity: Current BMI BMI (Calculated): 30.04    Lashina is currently in the action stage of change. As such, her goal is to continue with weight loss efforts.  She has agreed to the Category 3 plan and keeping a food journal with goal of 1,500 calories and 90 grams of protein daily.  Exercise goals: For substantial health benefits, adults should do at least 150 minutes (2 hours and 30 minutes) a week of moderate-intensity, or 75 minutes (1 hour and 15 minutes) a week of vigorous-intensity aerobic physical activity, or an equivalent combination of moderate- and vigorous-intensity aerobic activity. Aerobic activity should be performed in episodes of at least 10 minutes, and preferably, it should be spread throughout the week.  Behavioral modification strategies: increasing lean protein intake, no meal skipping, meal planning , increase water intake, better snacking choices, and increasing fiber rich foods.  Zyana has agreed to follow-up with our clinic in 2 weeks with Judeth Cornfield to discuss exercise.    Labs reviewed today from last visit (CMP, Lipids, HgbA1c, insulin, vitamin D, B 12, and thyroid panel).   Objective:   VITALS: Per patient if applicable, see vitals. GENERAL: Alert and in no acute distress. CARDIOPULMONARY: No increased WOB. Speaking in clear sentences.  PSYCH: Pleasant and cooperative. Speech normal rate and rhythm. Affect is appropriate. Insight and judgement are appropriate. Attention is focused, linear, and appropriate.  NEURO: Oriented as arrived to appointment on time with no prompting.   Attestation Statements:    This was prepared with the assistance  of Engineer, civil (consulting).  Occasional wrong-word or sound-a-like substitutions may have occurred due to the inherent limitations of voice recognition software.   Corinna Capra, DO

## 2024-04-03 ENCOUNTER — Encounter: Payer: Self-pay | Admitting: Nurse Practitioner

## 2024-04-03 ENCOUNTER — Ambulatory Visit: Admitting: Nurse Practitioner

## 2024-04-03 VITALS — BP 122/76 | HR 101 | Temp 98.0°F | Ht 66.0 in | Wt 185.0 lb

## 2024-04-03 DIAGNOSIS — Z6829 Body mass index (BMI) 29.0-29.9, adult: Secondary | ICD-10-CM

## 2024-04-03 DIAGNOSIS — R000 Tachycardia, unspecified: Secondary | ICD-10-CM | POA: Diagnosis not present

## 2024-04-03 DIAGNOSIS — E669 Obesity, unspecified: Secondary | ICD-10-CM

## 2024-04-03 NOTE — Progress Notes (Signed)
 Office: [phone]  /  Fax: [phone]  WEIGHT SUMMARY AND BIOMETRICS  Weight Lost Since Last Visit: 1lb  Weight Gained Since Last Visit: 0lb   Vitals Temp: 98 F (36.7 C) BP: 122/76 Pulse Rate: (!) 101 SpO2: 100 %   Anthropometric Measurements Height: 5\' 6"  (1.676 m) Weight: 185 lb (83.9 kg) BMI (Calculated): 29.87 Weight at Last Visit: 186lb Weight Lost Since Last Visit: 1lb Weight Gained Since Last Visit: 0lb Starting Weight: 191lb Total Weight Loss (lbs): 6 lb (2.722 kg)   Body Composition  Body Fat %: 37.1 % Fat Mass (lbs): 68.8 lbs Muscle Mass (lbs): 110.8 lbs Total Body Water (lbs): 81.2 lbs Visceral Fat Rating : 8   Other Clinical Data Fasting: No Labs: No Today's Visit #: 3 Starting Date: 03/06/24     HPI  Chief Complaint: OBESITY  Margaret Rich is here to discuss her progress with her obesity treatment plan. She is on the keeping a food journal and adhering to recommended goals of 1500-1600 calories and 45 protein and states she is following her eating plan approximately 80-90 % of the time. She states she is exercising 0 minutes 0 days per week.   Interval History:  Since last office visit she has lost 1 pound.  She is tracking using myfitness pal.  She is averaging around 1315-1500 calories, 45-60 protein, 128-150 carbs and 44-65 fats.  She has been sick since her last visit and wasn't able to exercise or follow the meal plan for a couple weeks.  She is overall feeling better.  Prior to getting sick she was going to yoga and HIT training 3 days per week.  She is drinking water, coffee.    Her goal is to lose weight to help with OSAS and tachycardia. She hasn't been able to tolerated wearing CPAP due to increased migraines with CPAP use.    Pharmacotherapy for weight loss: She is not currently taking medications  for medical weight loss.    Previous pharmacotherapy for medical weight loss:  None  Bariatric surgery:  Patient has not had  bariatric surgery.     PHYSICAL EXAM:  Blood pressure 122/76, pulse (!) 101, temperature 98 F (36.7 C), height 5\' 6"  (1.676 m), weight 185 lb (83.9 kg), last menstrual period 02/22/2024, SpO2 100%. Body mass index is 29.86 kg/m.  General: She is overweight, cooperative, alert, well developed, and in no acute distress. PSYCH: Has normal mood, affect and thought process.   Extremities: No edema.  Neurologic: No gross sensory or motor deficits. No tremors or fasciculations noted.    DIAGNOSTIC DATA REVIEWED:  BMET    Component Value Date/Time   NA 138 03/06/2024 0844   K 4.4 03/06/2024 0844   CL 102 03/06/2024 0844   CO2 21 03/06/2024 0844   GLUCOSE 82 03/06/2024 0844   GLUCOSE 97 08/24/2023 1118   BUN 8 03/06/2024 0844   CREATININE 0.84 03/06/2024 0844   CALCIUM 9.4 03/06/2024 0844   Lab Results  Component Value Date   HGBA1C 5.6 03/06/2024   HGBA1C 5.5 05/11/2020   Lab Results  Component Value Date   INSULIN 21.0 03/06/2024   Lab Results  Component Value Date   TSH 2.68 08/24/2023   CBC    Component Value Date/Time   WBC 10.5 08/24/2023 1118   RBC 4.57 08/24/2023 1118   HGB 12.9 08/24/2023 1118   HCT 40.1 08/24/2023 1118   PLT 253.0 08/24/2023 1118   MCV 87.9 08/24/2023 1118   MCHC 32.2  08/24/2023 1118   RDW 14.8 08/24/2023 1118   Iron Studies No results found for: "IRON", "TIBC", "FERRITIN", "IRONPCTSAT" Lipid Panel     Component Value Date/Time   CHOL 219 (H) 03/06/2024 0844   TRIG 150 (H) 03/06/2024 0844   HDL 55 03/06/2024 0844   LDLCALC 137 (H) 03/06/2024 0844   Hepatic Function Panel     Component Value Date/Time   PROT 7.3 03/06/2024 0844   ALBUMIN 4.7 03/06/2024 0844   AST 21 03/06/2024 0844   ALT 22 03/06/2024 0844   ALKPHOS 76 03/06/2024 0844   BILITOT 0.4 03/06/2024 0844      Component Value Date/Time   TSH 2.68 08/24/2023 1118   Nutritional Lab Results  Component Value Date   VD25OH 37.3 03/06/2024   VD25OH 29.94 (L)  05/11/2020     ASSESSMENT AND PLAN  TREATMENT PLAN FOR OBESITY:  Recommended Dietary Goals  Margaret Rich is currently in the action stage of change. As such, her goal is to continue weight management plan. She has agreed to keeping a food journal and adhering to recommended goals of 1500 calories and 90 protein. Sill increase protein intake as tolerated.    Behavioral Intervention  We discussed the following Behavioral Modification Strategies today: increasing lean protein intake to established goals, decreasing simple carbohydrates , increasing vegetables, increasing fiber rich foods, increasing water intake , work on meal planning and preparation, and continue to work on maintaining a reduced calorie state, getting the recommended amount of protein, incorporating whole foods, making healthy choices, staying well hydrated and practicing mindfulness when eating..  Additional resources provided today: NA  Recommended Physical Activity Goals  Margaret Rich has been advised to work up to 150 minutes of moderate intensity aerobic activity a week and strengthening exercises 2-3 times per week for cardiovascular health, weight loss maintenance and preservation of muscle mass.   She has agreed to Think about enjoyable ways to increase daily physical activity and overcoming barriers to exercise, Increase physical activity in their day and reduce sedentary time (increase NEAT)., and Work on scheduling and tracking physical activity.    Goals: Start swimming 2 days per week.   One day resistance training  Pharmacotherapy Avoid Phentermine and Qsymia due to tachycardia  ASSOCIATED CONDITIONS ADDRESSED TODAY  Action/Plan  Tachycardia I've asked her to track/monitor her heart rate.  She plans to start swimming 2 days per week and start resistance training one day per week.  Will monitor resting heart rate and heart rate with activity.  Consider referral to cardiology.    Generalized obesity  BMI  29.0-29.9,adult         Return in about 3 weeks (around 04/24/2024).Aaron Aas She was informed of the importance of frequent follow up visits to maximize her success with intensive lifestyle modifications for her multiple health conditions.   ATTESTASTION STATEMENTS:  Reviewed by clinician on day of visit: allergies, medications, problem list, medical history, surgical history, family history, social history, and previous encounter notes.   Time spent on visit including pre-visit chart review and post-visit care and charting was 30 minutes on calories, macros, exercise, tracking and tachycardia.    Crist Dominion. Gaelan Glennon FNP-C

## 2024-04-24 ENCOUNTER — Encounter: Payer: Self-pay | Admitting: Nurse Practitioner

## 2024-04-24 ENCOUNTER — Ambulatory Visit: Admitting: Nurse Practitioner

## 2024-04-24 VITALS — BP 123/83 | HR 98 | Temp 98.8°F | Ht 66.0 in | Wt 182.0 lb

## 2024-04-24 DIAGNOSIS — G4733 Obstructive sleep apnea (adult) (pediatric): Secondary | ICD-10-CM

## 2024-04-24 DIAGNOSIS — Z6829 Body mass index (BMI) 29.0-29.9, adult: Secondary | ICD-10-CM

## 2024-04-24 DIAGNOSIS — E669 Obesity, unspecified: Secondary | ICD-10-CM

## 2024-04-24 NOTE — Progress Notes (Signed)
 Office: [phone]  /  Fax: [phone]  WEIGHT SUMMARY AND BIOMETRICS  Weight Lost Since Last Visit: 3lb  Weight Gained Since Last Visit: 0lb   Vitals Temp: 98.8 F (37.1 C) BP: 123/83 Pulse Rate: 98 SpO2: 100 %   Anthropometric Measurements Height: 5\' 6"  (1.676 m) Weight: 182 lb (82.6 kg) BMI (Calculated): 29.39 Weight at Last Visit: 185lb Weight Lost Since Last Visit: 3lb Weight Gained Since Last Visit: 0lb Starting Weight: 191lb Total Weight Loss (lbs): 9 lb (4.082 kg)   Body Composition  Body Fat %: 36 % Fat Mass (lbs): 65.6 lbs Muscle Mass (lbs): 110.8 lbs Total Body Water (lbs): 79.6 lbs Visceral Fat Rating : 7   Other Clinical Data Fasting: Yes Labs: No Today's Visit #: 4 Starting Date: 03/06/24     HPI  Chief Complaint: OBESITY  Margaret Rich is here to discuss her progress with her obesity treatment plan. She is on the keeping a food journal and adhering to recommended goals of 1500-1600 calories and 45 protein and states she is following her eating plan approximately 75-80 % of the time. She states she is exercising 30-180 minutes 3 days per week.   Interval History:  Since last office visit she has lost 3 pounds.  She had multiple celebrations since her last visit. She is using loseit app.  She is averaging around 1600-1700 calories, 70 grams protein, 150 carbs and 50 fats.  She is drinking water, coffee and seltzer water. She is swimming and doing yard work to stay active.   Notes some fatigue.  Hasn't been taking her MVI and plans to restart.   Her goal is to lose weight to help with OSAS and tachycardia. She hasn't been able to tolerated wearing CPAP due to increased migraines with CPAP use.      Pharmacotherapy for weight loss: She is not currently taking medications  for medical weight loss.     Previous pharmacotherapy for medical weight loss:  None   Bariatric surgery:  Patient has not had bariatric surgery.    PHYSICAL EXAM:  Blood  pressure 123/83, pulse 98, temperature 98.8 F (37.1 C), height 5\' 6"  (1.676 m), weight 182 lb (82.6 kg), SpO2 100%. Body mass index is 29.38 kg/m.  General: She is overweight, cooperative, alert, well developed, and in no acute distress. PSYCH: Has normal mood, affect and thought process.   Extremities: No edema.  Neurologic: No gross sensory or motor deficits. No tremors or fasciculations noted.    DIAGNOSTIC DATA REVIEWED:  BMET    Component Value Date/Time   NA 138 03/06/2024 0844   K 4.4 03/06/2024 0844   CL 102 03/06/2024 0844   CO2 21 03/06/2024 0844   GLUCOSE 82 03/06/2024 0844   GLUCOSE 97 08/24/2023 1118   BUN 8 03/06/2024 0844   CREATININE 0.84 03/06/2024 0844   CALCIUM 9.4 03/06/2024 0844   Lab Results  Component Value Date   HGBA1C 5.6 03/06/2024   HGBA1C 5.5 05/11/2020   Lab Results  Component Value Date   INSULIN  21.0 03/06/2024   Lab Results  Component Value Date   TSH 2.68 08/24/2023   CBC    Component Value Date/Time   WBC 10.5 08/24/2023 1118   RBC 4.57 08/24/2023 1118   HGB 12.9 08/24/2023 1118   HCT 40.1 08/24/2023 1118   PLT 253.0 08/24/2023 1118   MCV 87.9 08/24/2023 1118   MCHC 32.2 08/24/2023 1118   RDW 14.8 08/24/2023 1118   Iron Studies No results  found for: "IRON", "TIBC", "FERRITIN", "IRONPCTSAT" Lipid Panel     Component Value Date/Time   CHOL 219 (H) 03/06/2024 0844   TRIG 150 (H) 03/06/2024 0844   HDL 55 03/06/2024 0844   LDLCALC 137 (H) 03/06/2024 0844   Hepatic Function Panel     Component Value Date/Time   PROT 7.3 03/06/2024 0844   ALBUMIN 4.7 03/06/2024 0844   AST 21 03/06/2024 0844   ALT 22 03/06/2024 0844   ALKPHOS 76 03/06/2024 0844   BILITOT 0.4 03/06/2024 0844      Component Value Date/Time   TSH 2.68 08/24/2023 1118   Nutritional Lab Results  Component Value Date   VD25OH 37.3 03/06/2024   VD25OH 29.94 (L) 05/11/2020     ASSESSMENT AND PLAN  TREATMENT PLAN FOR OBESITY:  Recommended Dietary  Goals  Margaret Rich is currently in the action stage of change. As such, her goal is to continue weight management plan. She has agreed to keeping a food journal and adhering to recommended goals of 1600-1700 calories and 90 grams protein.  Behavioral Intervention  We discussed the following Behavioral Modification Strategies today: increasing lean protein intake to established goals, decreasing simple carbohydrates , increasing vegetables, increasing fiber rich foods, increasing water intake , and continue to work on maintaining a reduced calorie state, getting the recommended amount of protein, incorporating whole foods, making healthy choices, staying well hydrated and practicing mindfulness when eating..  Additional resources provided today: NA  Recommended Physical Activity Goals  Margaret Rich has been advised to work up to 150 minutes of moderate intensity aerobic activity a week and strengthening exercises 2-3 times per week for cardiovascular health, weight loss maintenance and preservation of muscle mass.   She has agreed to Think about enjoyable ways to increase daily physical activity and overcoming barriers to exercise, Increase physical activity in their day and reduce sedentary time (increase NEAT)., Increase the intensity, frequency or duration of strengthening exercises , and Increase the intensity, frequency or duration of aerobic exercises     ASSOCIATED CONDITIONS ADDRESSED TODAY  Action/Plan  Mild obstructive sleep apnea Will continue to monitor.  Patient is unable to tolerate CPAP.  Will discuss additional options at next visit.    Generalized obesity  BMI 29.0-29.9,adult      Offered to obtain labs today.  Patient    Return in about 3 weeks (around 05/15/2024).Aaron Aas She was informed of the importance of frequent follow up visits to maximize her success with intensive lifestyle modifications for her multiple health conditions.   ATTESTASTION STATEMENTS:  Reviewed by clinician  on day of visit: allergies, medications, problem list, medical history, surgical history, family history, social history, and previous encounter notes.   Time spent on visit including pre-visit chart review and post-visit care and charting was 30 minutes discussing dietary changes, exercise and weight loss.    Crist Dominion. Dmitriy Gair FNP-C

## 2024-05-20 ENCOUNTER — Encounter: Payer: Self-pay | Admitting: Nurse Practitioner

## 2024-05-20 ENCOUNTER — Ambulatory Visit: Admitting: Nurse Practitioner

## 2024-05-20 VITALS — BP 124/81 | HR 100 | Temp 98.0°F | Ht 66.0 in | Wt 183.0 lb

## 2024-05-20 DIAGNOSIS — Z6829 Body mass index (BMI) 29.0-29.9, adult: Secondary | ICD-10-CM

## 2024-05-20 DIAGNOSIS — E669 Obesity, unspecified: Secondary | ICD-10-CM

## 2024-05-20 DIAGNOSIS — R638 Other symptoms and signs concerning food and fluid intake: Secondary | ICD-10-CM | POA: Diagnosis not present

## 2024-05-20 NOTE — Progress Notes (Signed)
 Office: [phone]  /  Fax: [phone]  WEIGHT SUMMARY AND BIOMETRICS  Weight Lost Since Last Visit: 0  Weight Gained Since Last Visit: 1lb   Vitals Temp: 98 F (36.7 C) BP: 124/81 Pulse Rate: 100 SpO2: 99 %   Anthropometric Measurements Height: 5\' 6"  (1.676 m) Weight: 183 lb (83 kg) BMI (Calculated): 29.55 Weight at Last Visit: 182lb Weight Lost Since Last Visit: 0 Weight Gained Since Last Visit: 1lb Starting Weight: 191lb Total Weight Loss (lbs): 8 lb (3.629 kg)   Body Composition  Body Fat %: 35.8 % Fat Mass (lbs): 65.6 lbs Muscle Mass (lbs): 111.6 lbs Total Body Water (lbs): 79.4 lbs Visceral Fat Rating : 7   Other Clinical Data Fasting: no Labs: no Today's Visit #: 5 Starting Date: 03/06/24     HPI  Chief Complaint: OBESITY  Amamda is here to discuss her progress with her obesity treatment plan. She is on the keeping a food journal and adhering to recommended goals of 1,500-1,600 calories and 45 protein and states she is following her eating plan approximately 50 % of the time. She states she is exercising 30 minutes 2 days per week.   Interval History:  Since last office visit she has gained 1 pound.  She reports that she got off track since her last visit and hasn't been tracking.  She is not skipping meals and is eating a protein with each meal.  She struggles in the evening with snacking.  Needs to work on working on portion control.  She is drinking water and fairlife milk/almond milk/protein powder.   Pharmacotherapy for weight loss: She is not currently taking medications  for medical weight loss.     Previous pharmacotherapy for medical weight loss:  None   Bariatric surgery:  Patient has not had bariatric surgery.   PHYSICAL EXAM:  Blood pressure 124/81, pulse 100, temperature 98 F (36.7 C), height 5\' 6"  (1.676 m), weight 183 lb (83 kg), SpO2 99%. Body mass index is 29.54 kg/m.  General: She is overweight, cooperative, alert,  well developed, and in no acute distress. PSYCH: Has normal mood, affect and thought process.   Extremities: No edema.  Neurologic: No gross sensory or motor deficits. No tremors or fasciculations noted.    DIAGNOSTIC DATA REVIEWED:  BMET    Component Value Date/Time   NA 138 03/06/2024 0844   K 4.4 03/06/2024 0844   CL 102 03/06/2024 0844   CO2 21 03/06/2024 0844   GLUCOSE 82 03/06/2024 0844   GLUCOSE 97 08/24/2023 1118   BUN 8 03/06/2024 0844   CREATININE 0.84 03/06/2024 0844   CALCIUM 9.4 03/06/2024 0844   Lab Results  Component Value Date   HGBA1C 5.6 03/06/2024   HGBA1C 5.5 05/11/2020   Lab Results  Component Value Date   INSULIN  21.0 03/06/2024   Lab Results  Component Value Date   TSH 2.68 08/24/2023   CBC    Component Value Date/Time   WBC 10.5 08/24/2023 1118   RBC 4.57 08/24/2023 1118   HGB 12.9 08/24/2023 1118   HCT 40.1 08/24/2023 1118   PLT 253.0 08/24/2023 1118   MCV 87.9 08/24/2023 1118   MCHC 32.2 08/24/2023 1118   RDW 14.8 08/24/2023 1118   Iron Studies No results found for: "IRON", "TIBC", "FERRITIN", "IRONPCTSAT" Lipid Panel     Component Value Date/Time   CHOL 219 (H) 03/06/2024 0844   TRIG 150 (H) 03/06/2024 0844   HDL 55 03/06/2024 0844   LDLCALC 137 (  H) 03/06/2024 0844   Hepatic Function Panel     Component Value Date/Time   PROT 7.3 03/06/2024 0844   ALBUMIN 4.7 03/06/2024 0844   AST 21 03/06/2024 0844   ALT 22 03/06/2024 0844   ALKPHOS 76 03/06/2024 0844   BILITOT 0.4 03/06/2024 0844      Component Value Date/Time   TSH 2.68 08/24/2023 1118   Nutritional Lab Results  Component Value Date   VD25OH 37.3 03/06/2024   VD25OH 29.94 (L) 05/11/2020     ASSESSMENT AND PLAN  TREATMENT PLAN FOR OBESITY:  Recommended Dietary Goals  Jourdan is currently in the action stage of change. As such, her goal is to continue weight management plan. She has agreed to keeping a food journal and adhering to recommended goals of  1500-1600 calories and 100 grams protein.  Behavioral Intervention  We discussed the following Behavioral Modification Strategies today: increasing lean protein intake to established goals, decreasing simple carbohydrates , increasing vegetables, increasing fiber rich foods, increasing water intake , work on meal planning and preparation, work on tracking and journaling calories using tracking application, reading food labels , keeping healthy foods at home, and continue to work on maintaining a reduced calorie state, getting the recommended amount of protein, incorporating whole foods, making healthy choices, staying well hydrated and practicing mindfulness when eating..  Additional resources provided today: NA  Recommended Physical Activity Goals  Janeya has been advised to work up to 150 minutes of moderate intensity aerobic activity a week and strengthening exercises 2-3 times per week for cardiovascular health, weight loss maintenance and preservation of muscle mass.   She has agreed to Think about enjoyable ways to increase daily physical activity and overcoming barriers to exercise, Increase physical activity in their day and reduce sedentary time (increase NEAT)., and continue to gradually increase the amount and intensity of exercise routine   Pharmacotherapy Consider topamax for cravings but concerned it might make her IC worse.  Would like her to see Urogyn first and then add based upon their recommendations.      ASSOCIATED CONDITIONS ADDRESSED TODAY  Action/Plan  Abnormal craving Will continue to monitor.   Consider Topamax vs Wellbutrin .   Would like her to see Urogyn first and then add based upon their recommendations.      Generalized obesity  BMI 29.0-29.9,adult         Return in about 4 weeks (around 06/17/2024).Aaron Aas She was informed of the importance of frequent follow up visits to maximize her success with intensive lifestyle modifications for her multiple health  conditions.   ATTESTASTION STATEMENTS:  Reviewed by clinician on day of visit: allergies, medications, problem list, medical history, surgical history, family history, social history, and previous encounter notes.   Time spent on visit including pre-visit chart review and post-visit care and charting was 30 minutes.    Crist Dominion. Vada Yellen FNP-C

## 2024-05-22 ENCOUNTER — Encounter: Payer: Self-pay | Admitting: Orthopedic Surgery

## 2024-06-05 ENCOUNTER — Ambulatory Visit: Admitting: Nurse Practitioner

## 2024-06-09 ENCOUNTER — Ambulatory Visit: Admitting: Obstetrics

## 2024-06-09 ENCOUNTER — Encounter: Payer: Self-pay | Admitting: Obstetrics

## 2024-06-09 ENCOUNTER — Ambulatory Visit: Payer: Self-pay | Admitting: Obstetrics

## 2024-06-09 VITALS — BP 128/90 | HR 71 | Ht 67.32 in | Wt 188.0 lb

## 2024-06-09 DIAGNOSIS — R102 Pelvic and perineal pain: Secondary | ICD-10-CM | POA: Diagnosis not present

## 2024-06-09 DIAGNOSIS — N3010 Interstitial cystitis (chronic) without hematuria: Secondary | ICD-10-CM | POA: Insufficient documentation

## 2024-06-09 DIAGNOSIS — N8110 Cystocele, unspecified: Secondary | ICD-10-CM | POA: Diagnosis not present

## 2024-06-09 DIAGNOSIS — K602 Anal fissure, unspecified: Secondary | ICD-10-CM

## 2024-06-09 DIAGNOSIS — N393 Stress incontinence (female) (male): Secondary | ICD-10-CM | POA: Insufficient documentation

## 2024-06-09 LAB — POCT URINALYSIS DIP (CLINITEK)
Bilirubin, UA: NEGATIVE
Bilirubin, UA: NEGATIVE
Blood, UA: NEGATIVE
Glucose, UA: NEGATIVE mg/dL
Glucose, UA: NEGATIVE mg/dL
Ketones, POC UA: NEGATIVE mg/dL
Ketones, POC UA: NEGATIVE mg/dL
Leukocytes, UA: NEGATIVE
Leukocytes, UA: NEGATIVE
Nitrite, UA: NEGATIVE
Nitrite, UA: NEGATIVE
POC PROTEIN,UA: NEGATIVE
Spec Grav, UA: 1.02 (ref 1.010–1.025)
Spec Grav, UA: 1.025 (ref 1.010–1.025)
Urobilinogen, UA: 0.2 U/dL
Urobilinogen, UA: 0.2 U/dL
pH, UA: 6.5 (ref 5.0–8.0)
pH, UA: 7 (ref 5.0–8.0)

## 2024-06-09 MED ORDER — DICLOFENAC SODIUM 1 % EX GEL: 2.0000 g | Freq: Four times a day (QID) | CUTANEOUS | 0 refills | Status: AC

## 2024-06-09 MED ORDER — CIMETIDINE 200 MG PO TABS: 200.0000 mg | ORAL_TABLET | Freq: Every day | ORAL | 2 refills | Status: DC

## 2024-06-09 MED ORDER — PHENAZOPYRIDINE HCL 200 MG PO TABS: 200.0000 mg | ORAL_TABLET | Freq: Three times a day (TID) | ORAL | 0 refills | Status: AC | PRN

## 2024-06-09 NOTE — Assessment & Plan Note (Signed)
-   POCT catheterized sample negative, spotting - For treatment of stress urinary incontinence,  non-surgical options include expectant management, weight loss, physical therapy, as well as a pessary.  Surgical options include a midurethral sling, Burch urethropexy, and transurethral injection of a bulking agent. - referral to pelvic floor PT

## 2024-06-09 NOTE — Assessment & Plan Note (Addendum)
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  We also discussed using pyridium and similar over-the-counter medications for pain relief as needed. We discussed the pentad of medications including Tums, an antihistamine such as Vistaril, amitriptyline, and L-arginine.  We also discussed in-office bladder instillations for pain flares, as well as cystoscopy with hydrodistention in the operating room, which can be both diagnostic and therapeutic. She was also given information on the IC Network at https://www.ic-network.com for bladder diet suggestions and patient forums for support. - encouraged referral to pelvic floor, pt to call for appt  - trial of cimetidine at bedtime and titrate up to 2 tabs/day as needed  - encouraged stress management and CAM therapies - encouraged to return if change in urinary symptoms to r/o UTI and consider bladder instillations - discussed overlapping symptomatology with OAB and rUTI

## 2024-06-09 NOTE — Progress Notes (Signed)
 New Patient Evaluation and Consultation  Referring Provider: Jason Leita Repine,* PCP: Jason Leita Repine, FNP Date of Service: 06/09/2024  SUBJECTIVE Chief Complaint: New Patient (Initial Visit) Margaret Rich is a 43 y.o. female here today for IC )  History of Present Illness: Margaret Rich is a 43 y.o. White or Caucasian female seen in consultation at the request of NP [facility] for evaluation of suspected interstitial cystitis.    Urinary symptoms with increased frequency and urgency started in 2012, moved around 2012 to a place with well water. Evaluated by urology around 15 years ago in [place]  prior to childbirth, negative workup with cystoscopy (reports irritation around urethra), X-ray to r/o retained copper IUD, and advised timed voids every 2 hours. Previously attributed to uterine discomfort due to heavy bleeding after negative urology workup.  Urinary symptoms worsened 2-3 years ago, underwent gen surgery workup by Dr. Sheldon due to anal fissures Mirena IUD placed 11/29/23    Tried amitriptyline 10mg  by her midwife with some temporary relief initially, increased to 25mg  without relief. Pelvic floor physical therapy postpartum for both pregnancies and recently went to [facility] for PT x 2.  NSAIDs and heating with relief.  Denies diet modification, bladder instillations.  Review of records significant for: History of anal fissures with nifedipine use and biopsy in 2019 with COLONIC MUCOSA WITH BENIGN LYMPHOID AGGREGATES AND SLIGHT HYPEREMIA. Last evaluated by Dr. 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  Perirectal abscess/fistula she does have a cord in the posterior anal canal that seems to go slightly left posterior that raises suspicion of possible mild fistula. A few tiny fibrotic tags raises suspicion of external opening at anal verge but otherwise no obvious  external openings. External hemorrhoids Small fibrous posterior anal verge tag   Offered surgical treatment and EUA, postponed due to mild symptoms at that time.  Urinary Symptoms: Leaks urine with cough/ sneeze, laughing, and exercise after 1st delivery Leaks 0-2 time(s) per days.  Pad use: 1 liners/ mini-pads per day.   Patient is bothered by UI symptoms.  Day time voids 6-8.  Nocturia: 1 times per night to void. Voiding dysfunction:  does not empty bladder well.  Patient does not use a catheter to empty bladder.  When urinating, patient feels a weak stream and difficulty starting urine stream since onset of urinary symptoms Drinks: 48oz water per day, rare caffeine use due to worsening symptoms, 6-8oz coke once a week  UTIs: 0 UTI's in the last year.   Denies history of blood in urine, kidney or bladder stones, pyelonephritis, bladder cancer, and kidney cancer No results found for the last 90 days.   Pelvic Organ Prolapse Symptoms:                  Patient Admits to a feeling of a bulge the vaginal area. It has been present for 8 years.  Patient Admits to seeing a bulge if she is on the toilet when she feels it at the opening.  This bulge is bothersome.  Bowel Symptom: Bowel movements: 1-2 time(s) per day with Bristol IV or VI Stool consistency: soft  Straining: no.  Splinting: no.  Incomplete evacuation: no.  Patient Denies accidental bowel leakage / fecal incontinence Bowel regimen: none Last colonoscopy: Results skin tags on perianal exam, internal hemorrhoids on sigmoidoscopy for anal fissues 12/10/18 [facility]          Current Care Gaps  Colonoscopy (Every 10 Years) Never done   No completion history exists for this topic.                Sexual Function Sexually active: yes.  Sexual orientation: Pansexual Pain with sex: No  Pelvic Pain Admits to 6-7/10 pelvic pain/discomfort Location: lower abdomen, suprapubic Pain occurs: most of the  time Prior pain treatment: Amitriptyline with reduction to 3/10, causes dry mouth Improved by: Ibuprofen, warmth Worsened by: carbonated beverages, alcohol   Past Medical History:  Past Medical History:  Diagnosis Date   Migraine    Sleep apnea      Past Surgical History:   Past Surgical History:  Procedure Laterality Date   CARPAL TUNNEL RELEASE  12/2023   INTRAUTERINE DEVICE INSERTION       Past OB/GYN History: OB History  Gravida Para Term Preterm AB Living  2 2 1   2   SAB IAB Ectopic Multiple Live Births      1    # Outcome Date GA Lbr Len/2nd Weight Sex Type Anes PTL Lv  2 Para           1 Term     M Vag-Spont   LIV    Vaginal deliveries: 2, 8lbs water birth with periurethral laceration repaired complicated by postpartum uterine infection,  Forceps/ Vacuum deliveries: 0, Cesarean section: 0 Menopausal: No, LMP No LMP recorded. Contraception: Mirena IUD. Last pap smear was unknown timing.  Any history of abnormal pap smears: no. No results found for: DIAGPAP, HPVHIGH, ADEQPAP  Medications: Patient has a current medication list which includes the following prescription(s): amitriptyline, bupropion , cimetidine, diclofenac sodium, magnesium gluconate, multivitamin-lutein, fish oil, phenazopyridine, sumatriptan , vitamin c with rose hips, azithromycin , fluticasone  furoate-vilanterol, ondansetron , and OVER THE COUNTER MEDICATION.   Allergies: Patient is allergic to latex.   Social History:  Social History   Tobacco Use   Smoking status: Former    Current packs/day: 0.00    Types: Cigarettes    Start date: 05/19/2003    Quit date: 05/18/2008    Years since quitting: 16.0   Smokeless tobacco: Never   Tobacco comments:    Social smoker;   Vaping Use   Vaping status: Never Used  Substance Use Topics   Alcohol use: Yes   Drug use: Never    Relationship status: married Patient lives with her spouse and 2 children.   Patient is employed as a Nature conservation officer. Regular exercise: Yes: swim 1-2x/week History of abuse: No  Family History:   Family History  Problem Relation Age of Onset   Alcohol abuse Father    Cancer Father    Alcohol abuse Brother    Ovarian cancer Maternal Grandmother    Diabetes Maternal Grandmother    High blood pressure Maternal Grandmother    Alcohol abuse Maternal Grandfather    Heart disease Maternal Grandfather    High Cholesterol Maternal Grandfather    High blood pressure Maternal Grandfather    Alcohol abuse Paternal Grandmother    Alcohol abuse Paternal Grandfather    Cancer Paternal Grandfather    Colon cancer Neg Hx    Esophageal cancer Neg Hx    Inflammatory bowel disease Neg Hx    Liver disease Neg Hx    Pancreatic cancer Neg Hx    Rectal cancer Neg Hx    Stomach cancer Neg Hx    Renal cancer Neg Hx      Review of Systems: Review of Systems  Constitutional:  Positive for  malaise/fatigue and weight loss. Negative for fever.  Respiratory:  Negative for cough, shortness of breath and wheezing.   Cardiovascular:  Negative for chest pain, palpitations and leg swelling.  Gastrointestinal:  Positive for abdominal pain. Negative for blood in stool and constipation.  Genitourinary:  Negative for dysuria, frequency, hematuria and urgency.       Abnormal cycles  Skin:  Negative for rash.  Neurological:  Positive for dizziness and headaches. Negative for weakness.  Endo/Heme/Allergies:  Does not bruise/bleed easily.  Psychiatric/Behavioral:  Negative for depression. The patient is not nervous/anxious.      OBJECTIVE Physical Exam: Vitals:   06/09/24 1004 06/09/24 1132  BP: (!) 138/93 (!) 128/90  Pulse: 80 71  Weight: 188 lb (85.3 kg)   Height: 5' 7.32 (1.71 m)    Physical Exam Constitutional:      General: She is not in acute distress.    Appearance: Normal appearance.  Genitourinary:     Bladder and urethral meatus normal.     No lesions in the vagina.     Right Labia: No rash,  tenderness, lesions, skin changes or Bartholin's cyst.    Left Labia: No tenderness, lesions, skin changes, Bartholin's cyst or rash.    Vaginal bleeding (red/brown discharge, spotting) present.     No vaginal discharge, erythema, tenderness, ulceration or granulation tissue.     Apical vaginal prolapse present.    No vaginal atrophy present.     Right Adnexa: not tender, not full and no mass present.    Left Adnexa: not tender, not full and no mass present.    No cervical motion tenderness, discharge, friability, lesion, polyp or nabothian cyst.        Uterus is not enlarged, fixed, tender or irregular.     No uterine mass detected.    Urethral meatus caruncle not present.    No urethral prolapse, tenderness, mass, hypermobility, discharge or stress urinary incontinence with cough stress test present.     Bladder urgency on palpation.     Bladder is not tender and masses not present.      Pelvic Floor: Levator muscle strength is 3/5.    Levator ani not tender, obturator internus not tender, no asymmetrical contractions present and no pelvic spasms present.    Symmetrical pelvic sensation, anal wink present and BC reflex present.  Cardiovascular:     Rate and Rhythm: Normal rate.  Pulmonary:     Effort: Pulmonary effort is normal. No respiratory distress.  Abdominal:     General: There is no distension.     Palpations: Abdomen is soft. There is no mass.     Tenderness: There is abdominal tenderness.     Hernia: No hernia is present.   Neurological:     Mental Status: She is alert.  Vitals reviewed. Exam conducted with a chaperone present.      POP-Q:   POP-Q  -2                                            Aa   -1                                           Ba  -6  C   1                                            Gh  4                                            Pb  9                                            tvl   -2                                             Ap  -2                                            Bp  -7                                              D    Straight Catheterization Procedure for PVR: After verbal consent was obtained from the patient for catheterization to assess bladder emptying and residual volume the urethra and surrounding tissues were prepped with betadine and an in and out catheterization was performed.  PVR was 40mL.  Urine appeared clear yellow. The patient tolerated the procedure well.   Laboratory Results: Lab Results  Component Value Date   COLORU yellow 06/09/2024   CLARITYU clear 06/09/2024   GLUCOSEUR negative 06/09/2024   BILIRUBINUR negative 06/09/2024   SPECGRAV 1.025 06/09/2024   RBCUR negative 06/09/2024   PHUR 7.0 06/09/2024   UROBILINOGEN 0.2 06/09/2024   LEUKOCYTESUR Negative 06/09/2024    Lab Results  Component Value Date   CREATININE 0.84 03/06/2024   CREATININE 0.82 08/24/2023   CREATININE 0.77 06/23/2021    Lab Results  Component Value Date   HGBA1C 5.6 03/06/2024    Lab Results  Component Value Date   HGB 12.9 08/24/2023     ASSESSMENT AND PLAN Margaret Rich is a 43 y.o. with:  1. SUI (stress urinary incontinence, female)   2. Bladder pain syndrome   3. Pelvic pain   4. Pelvic organ prolapse quantification stage 1 cystocele   5. Anal fissure     SUI (stress urinary incontinence, female) Assessment & Plan: - POCT catheterized sample negative, spotting - For treatment of stress urinary incontinence,  non-surgical options include expectant management, weight loss, physical therapy, as well as a pessary.  Surgical options include a midurethral sling, Burch urethropexy, and transurethral injection of a bulking agent. - referral to pelvic floor PT  Orders: -     POCT URINALYSIS DIP (CLINITEK) -     AMB referral to rehabilitation -     Phenazopyridine HCl; Take 1 tablet (200 mg total) by mouth 3 (three) times daily as needed  for pain.  Dispense: 10 tablet; Refill: 0 -     POCT URINALYSIS DIP (CLINITEK)  Bladder pain syndrome Assessment & Plan: - urgency with palpation of bladder and pubic symphysis - triggered by caffeine, some relief with NSAIDs, heat, and amitriptyline - underwent pelvic floor PT sessions x 2 - For irritative bladder we reviewed treatment options including altering her diet to avoid irritative beverages and foods as well as attempting to decrease stress and other exacerbating factors.  We also discussed using pyridium and similar over-the-counter medications for pain relief as needed. We discussed the pentad of medications including Tums, an antihistamine such as Vistaril, amitriptyline, and L-arginine.  We also discussed in-office bladder instillations for pain flares, as well as cystoscopy with hydrodistention in the operating room, which can be both diagnostic and therapeutic. She was also given information on the IC Network at https://www.ic-network.com for bladder diet suggestions and patient forums for support. - encouraged referral to pelvic floor, pt to call for appt  - trial of cimetidine at bedtime and titrate up to 2 tabs/day as needed  - encouraged stress management and CAM therapies - encouraged to return if change in urinary symptoms to r/o UTI and consider bladder instillations  Orders: -     AMB referral to rehabilitation -     Cimetidine; Take 1 tablet (200 mg total) by mouth at bedtime.  Dispense: 30 tablet; Refill: 2  Pelvic pain Assessment & Plan: - reports pubic symphysis discomfort with pregnancy, underwent pelvic floor for pain and incontinence with some relief - pain with palpation of R ilioinguinal nerve distribution - The origin of pelvic pain can be multifactorial, including primary, reactive to a different pain source, nerve irritation, trauma, or even part of a centralized pain syndrome.Treatment options include pelvic floor physical therapy, stretching topical or  oral pain medication, trigger point injections or centrally acting pain medications.   - encouraged abdominal stretching, referral to pelvic floor PT - Rx Voltaren gel to use as needed for pain - continue NSAIDs, heat for pain relief - encouraged sleeping with pillow between her legs and abdominal/pelvic binder for pain with ambulation   Orders: -     Diclofenac Sodium; Apply 2 g topically 4 (four) times daily.  Dispense: 150 g; Refill: 0 -     Cimetidine; Take 1 tablet (200 mg total) by mouth at bedtime.  Dispense: 30 tablet; Refill: 2  Pelvic organ prolapse quantification stage 1 cystocele Assessment & Plan: - noted at vaginal opening when she is on the toilet - minimal symptoms - referral to pelvic floor PT - repeat exam if clinical change   Anal fissure Assessment & Plan: - h/o anal fissure and prior evaluation by Dr. Sheldon in 2023, postponed surgical intervention due to minimal symptoms - encouraged fiber supplementation to optimize stool consistency due to intermittent Bristol VI stool, encouraged squatting position for defecation to minimize straining and ensure pelvic floor relaxation - encouraged pt to follow-up with Dr. Sheldon if clinical change   Follow-up with Dr. Jason or GYN for routine pap smear screening.  Time spent: I spent 78 minutes dedicated to the care of this patient on the date of this encounter to include pre-visit review of records, face-to-face time with the patient discussing stress urinary incontinence, bladder pain syndrome, pelvic pain, stage I pelvic organ prolapse, history of anal fissure and post visit documentation and ordering medication/ testing.    Lianne ONEIDA Gillis, MD

## 2024-06-09 NOTE — Patient Instructions (Addendum)
 For treatment of stress urinary incontinence, which is leakage with physical activity/movement/strainging/coughing, we discussed expectant management versus nonsurgical options versus surgery. Nonsurgical options include weight loss, physical therapy, as well as a pessary.  Surgical options include a midurethral sling, which is a synthetic mesh sling that acts like a hammock under the urethra to prevent leakage of urine, a Burch urethropexy, and transurethral injection of a bulking agent.   For irritative bladder we reviewed treatment options including altering her diet to avoid irritative beverages and foods as well as attempting to decrease stress and other exacerbating factors.  We also discussed using pyridium and similar over-the-counter medications for pain relief as needed. We discussed the pentad of medications including Tums, an antihistamine such as Vistaril, amitriptyline, and L-arginine.  We also discussed in-office bladder instillations for pain flares, as well as cystoscopy with hydrodistention in the operating room, which can be both diagnostic and therapeutic. She was also given information on the IC Network at https://www.ic-network.com for bladder diet suggestions and patient forums for support.   Women should try to eat at least 21 to 25 grams of fiber a day, while men should aim for 30 to 38 grams a day. You can add fiber to your diet with food or a fiber supplement such as psyllium (metamucil), benefiber, or fibercon.   Here's a look at how much dietary fiber is found in some common foods. When buying packaged foods, check the Nutrition Facts label for fiber content. It can vary among brands.  Fruits Serving size Total fiber (grams)*  Raspberries 1 cup 8.0  Pear 1 medium 5.5  Apple, with skin 1 medium 4.5  Banana 1 medium 3.0  Orange 1 medium 3.0  Strawberries 1 cup 3.0   Vegetables Serving size Total fiber (grams)*  Green peas, boiled 1 cup 9.0  Broccoli, boiled 1 cup chopped  5.0  Turnip greens, boiled 1 cup 5.0  Brussels sprouts, boiled 1 cup 4.0  Potato, with skin, baked 1 medium 4.0  Sweet corn, boiled 1 cup 3.5  Cauliflower, raw 1 cup chopped 2.0  Carrot, raw 1 medium 1.5   Grains Serving size Total fiber (grams)*  Spaghetti, whole-wheat, cooked 1 cup 6.0  Barley, pearled, cooked 1 cup 6.0  Bran flakes 3/4 cup 5.5  Quinoa, cooked 1 cup 5.0  Oat bran muffin 1 medium 5.0  Oatmeal, instant, cooked 1 cup 5.0  Popcorn, air-popped 3 cups 3.5  Brown rice, cooked 1 cup 3.5  Bread, whole-wheat 1 slice 2.0  Bread, rye 1 slice 2.0   Legumes, nuts and seeds Serving size Total fiber (grams)*  Split peas, boiled 1 cup 16.0  Lentils, boiled 1 cup 15.5  Black beans, boiled 1 cup 15.0  Baked beans, canned 1 cup 10.0  Chia seeds 1 ounce 10.0  Almonds 1 ounce (23 nuts) 3.5  Pistachios 1 ounce (49 nuts) 3.0  Sunflower kernels 1 ounce 3.0  *Rounded to nearest 0.5 gram. Source: [employer] for [employer], [employer] can use Ibuprofen up to 600mg  and Tylenol  500mg  every 6 hours for pain or discomfort.  Use a pillow between your legs at night when you sleep to keep your hips in a neutral position.  Continue heating pad and stretching for abdominal wall muscle pain.  Use volteran gel up to 2g 4 times a day as needed over lower abdomen for pain.  Please call [phone] to schedule the earliest appointment for pelvic floor PT.

## 2024-06-09 NOTE — Assessment & Plan Note (Signed)
-   h/o anal fissure and prior evaluation by Dr. Sheldon in 2023, postponed surgical intervention due to minimal symptoms - encouraged fiber supplementation to optimize stool consistency due to intermittent Bristol VI stool, encouraged squatting position for defecation to minimize straining and ensure pelvic floor relaxation - encouraged pt to follow-up with Dr. Sheldon if clinical change

## 2024-06-09 NOTE — Assessment & Plan Note (Signed)
-   reports pubic symphysis discomfort with pregnancy, underwent pelvic floor for pain and incontinence with some relief - pain with palpation of R ilioinguinal nerve distribution - The origin of pelvic pain can be multifactorial, including primary, reactive to a different pain source, nerve irritation, trauma, or even part of a centralized pain syndrome.Treatment options include pelvic floor physical therapy, stretching topical or oral pain medication, trigger point injections or centrally acting pain medications.   - encouraged abdominal stretching, referral to pelvic floor PT - Rx Voltaren gel to use as needed for pain - continue NSAIDs, heat for pain relief - encouraged sleeping with pillow between her legs and abdominal/pelvic binder for pain with ambulation

## 2024-06-09 NOTE — Assessment & Plan Note (Signed)
-   noted at vaginal opening when she is on the toilet - minimal symptoms - referral to pelvic floor PT - repeat exam if clinical change

## 2024-06-10 ENCOUNTER — Ambulatory Visit: Admitting: Nurse Practitioner

## 2024-06-17 ENCOUNTER — Encounter: Payer: Self-pay | Admitting: Nurse Practitioner

## 2024-06-17 ENCOUNTER — Ambulatory Visit: Admitting: Nurse Practitioner

## 2024-06-17 VITALS — BP 126/81 | HR 61 | Temp 98.1°F | Ht 66.0 in | Wt 180.0 lb

## 2024-06-17 DIAGNOSIS — Z6829 Body mass index (BMI) 29.0-29.9, adult: Secondary | ICD-10-CM

## 2024-06-17 DIAGNOSIS — R638 Other symptoms and signs concerning food and fluid intake: Secondary | ICD-10-CM

## 2024-06-17 DIAGNOSIS — E669 Obesity, unspecified: Secondary | ICD-10-CM | POA: Diagnosis not present

## 2024-06-17 NOTE — Progress Notes (Signed)
 Office: [phone]  /  Fax: [phone]  WEIGHT SUMMARY AND BIOMETRICS  Weight Lost Since Last Visit: 3lb  Weight Gained Since Last Visit: 0lb   Vitals Temp: 98.1 F (36.7 C) BP: 126/81 Pulse Rate: 61 SpO2: 96 %   Anthropometric Measurements Height: 5' 6 (1.676 m) Weight: 180 lb (81.6 kg) BMI (Calculated): 29.07 Weight at Last Visit: 183lb Weight Lost Since Last Visit: 3lb Weight Gained Since Last Visit: 0lb Starting Weight: 191lb Total Weight Loss (lbs): 11 lb (4.99 kg)   Body Composition  Body Fat %: 34.7 % Fat Mass (lbs): 62.6 lbs Muscle Mass (lbs): 111.6 lbs Total Body Water (lbs): 77.8 lbs Visceral Fat Rating : 7   Other Clinical Data Fasting: Yes Labs: No Today's Visit #: 6 Starting Date: 03/06/24     HPI  Chief Complaint: OBESITY  Margaret Rich is here to discuss her progress with her obesity treatment plan. She is on the keeping a food journal and adhering to recommended goals of 1500-1600 calories and 45 protein and states she is following her eating plan approximately 70 % of the time. She states she is exercising 45 minutes 1 days per week.   Interval History:  Since last office visit she has lost 3 pounds.  She is averaging around 1500-1600 calories and 45 grams of protein.  She is eating noon to 10pm.  She is drinking water daily and occ coffee or coke.     Pharmacotherapy for weight loss: She is not currently taking medications  for medical weight loss.    We had discussed at her last visit about trying either Topamax or Wellbutrin  off label for abnormal cravings.  Patient was to discuss starting Topamax with Urogyn but did not discuss at her last visit.   Previous pharmacotherapy for medical weight loss:  None   Bariatric surgery:  Patient has not had bariatric surgery.    PHYSICAL EXAM:  Blood pressure 126/81, pulse 61, temperature 98.1 F (36.7 C), height 5' 6 (1.676 m), weight 180 lb (81.6 kg), SpO2 96%. Body mass index is 29.05  kg/m.  General: She is overweight, cooperative, alert, well developed, and in no acute distress. PSYCH: Has normal mood, affect and thought process.   Extremities: No edema.  Neurologic: No gross sensory or motor deficits. No tremors or fasciculations noted.    DIAGNOSTIC DATA REVIEWED:  BMET    Component Value Date/Time   NA 138 03/06/2024 0844   K 4.4 03/06/2024 0844   CL 102 03/06/2024 0844   CO2 21 03/06/2024 0844   GLUCOSE 82 03/06/2024 0844   GLUCOSE 97 08/24/2023 1118   BUN 8 03/06/2024 0844   CREATININE 0.84 03/06/2024 0844   CALCIUM 9.4 03/06/2024 0844   Lab Results  Component Value Date   HGBA1C 5.6 03/06/2024   HGBA1C 5.5 05/11/2020   Lab Results  Component Value Date   INSULIN  21.0 03/06/2024   Lab Results  Component Value Date   TSH 2.68 08/24/2023   CBC    Component Value Date/Time   WBC 10.5 08/24/2023 1118   RBC 4.57 08/24/2023 1118   HGB 12.9 08/24/2023 1118   HCT 40.1 08/24/2023 1118   PLT 253.0 08/24/2023 1118   MCV 87.9 08/24/2023 1118   MCHC 32.2 08/24/2023 1118   RDW 14.8 08/24/2023 1118   Iron Studies No results found for: IRON, TIBC, FERRITIN, IRONPCTSAT Lipid Panel     Component Value Date/Time   CHOL 219 (H) 03/06/2024 0844   TRIG 150 (H)  03/06/2024 0844   HDL 55 03/06/2024 0844   LDLCALC 137 (H) 03/06/2024 0844   Hepatic Function Panel     Component Value Date/Time   PROT 7.3 03/06/2024 0844   ALBUMIN 4.7 03/06/2024 0844   AST 21 03/06/2024 0844   ALT 22 03/06/2024 0844   ALKPHOS 76 03/06/2024 0844   BILITOT 0.4 03/06/2024 0844      Component Value Date/Time   TSH 2.68 08/24/2023 1118   Nutritional Lab Results  Component Value Date   VD25OH 37.3 03/06/2024   VD25OH 29.94 (L) 05/11/2020     ASSESSMENT AND PLAN  TREATMENT PLAN FOR OBESITY:  Recommended Dietary Goals  Margaret Rich is currently in the action stage of change. As such, her goal is to continue weight management plan. She has agreed to keeping  a food journal and adhering to recommended goals of 1500-1600 calories and 90+ proteins protein.  Needs to work on increasing protein intake.    Behavioral Intervention  We discussed the following Behavioral Modification Strategies today: increasing lean protein intake to established goals, decreasing simple carbohydrates , increasing vegetables, increasing fiber rich foods, increasing water intake , and continue to work on maintaining a reduced calorie state, getting the recommended amount of protein, incorporating whole foods, making healthy choices, staying well hydrated and practicing mindfulness when eating..  Additional resources provided today: protein ideas, protein shakes,   Recommended Physical Activity Goals  Margaret Rich has been advised to work up to 150 minutes of moderate intensity aerobic activity a week and strengthening exercises 2-3 times per week for cardiovascular health, weight loss maintenance and preservation of muscle mass.   She has agreed to Think about enjoyable ways to increase daily physical activity and overcoming barriers to exercise, Increase physical activity in their day and reduce sedentary time (increase NEAT)., and continue to gradually increase the amount and intensity of exercise routine   ASSOCIATED CONDITIONS ADDRESSED TODAY  Action/Plan  Abnormal craving Patient is currently doing well concerning her cravings and weight loss.  I have recommended that we hold off starting any medications at this time due to possible side effects.  Patient verbalizes understanding and is in agreement.  Generalized obesity  BMI 29.0-29.9,adult     Goals Increase protein-multiple handouts given today Increase water intake Start resistance training    Return in about 4 weeks (around 07/15/2024).[name] She was informed of the importance of frequent follow up visits to maximize her success with intensive lifestyle modifications for her multiple health  conditions.   ATTESTASTION STATEMENTS:  Reviewed by clinician on day of visit: allergies, medications, problem list, medical history, surgical history, family history, social history, and previous encounter notes.   Time spent on visit including pre-visit chart review and post-visit care and charting was 30+ minutes.    Corean SAUNDERS. Endi Lagman FNP-C

## 2024-06-19 ENCOUNTER — Ambulatory Visit: Payer: Self-pay | Admitting: Family

## 2024-06-19 ENCOUNTER — Other Ambulatory Visit

## 2024-06-19 ENCOUNTER — Telehealth (INDEPENDENT_AMBULATORY_CARE_PROVIDER_SITE_OTHER): Admitting: Family

## 2024-06-19 ENCOUNTER — Encounter: Payer: Self-pay | Admitting: Family

## 2024-06-19 ENCOUNTER — Telehealth: Payer: Self-pay

## 2024-06-19 VITALS — Ht 66.0 in

## 2024-06-19 DIAGNOSIS — R110 Nausea: Secondary | ICD-10-CM

## 2024-06-19 LAB — CBC WITH DIFFERENTIAL/PLATELET
Basophils Absolute: 0.1 10*3/uL (ref 0.0–0.1)
Basophils Relative: 0.6 % (ref 0.0–3.0)
Eosinophils Absolute: 0.1 10*3/uL (ref 0.0–0.7)
Eosinophils Relative: 1.7 % (ref 0.0–5.0)
HCT: 40.2 % (ref 36.0–46.0)
Hemoglobin: 13.4 g/dL (ref 12.0–15.0)
Lymphocytes Relative: 23.2 % (ref 12.0–46.0)
Lymphs Abs: 2 10*3/uL (ref 0.7–4.0)
MCHC: 33.4 g/dL (ref 30.0–36.0)
MCV: 83.8 fl (ref 78.0–100.0)
Monocytes Absolute: 0.7 10*3/uL (ref 0.1–1.0)
Monocytes Relative: 8.4 % (ref 3.0–12.0)
Neutro Abs: 5.7 10*3/uL (ref 1.4–7.7)
Neutrophils Relative %: 66.1 % (ref 43.0–77.0)
Platelets: 220 10*3/uL (ref 150.0–400.0)
RBC: 4.8 Mil/uL (ref 3.87–5.11)
RDW: 15.5 % (ref 11.5–15.5)
WBC: 8.7 10*3/uL (ref 4.0–10.5)

## 2024-06-19 LAB — COMPREHENSIVE METABOLIC PANEL WITH GFR
ALT: 14 U/L (ref 0–35)
AST: 15 U/L (ref 0–37)
Albumin: 4.8 g/dL (ref 3.5–5.2)
Alkaline Phosphatase: 64 U/L (ref 39–117)
BUN: 14 mg/dL (ref 6–23)
CO2: 27 meq/L (ref 19–32)
Calcium: 9.3 mg/dL (ref 8.4–10.5)
Chloride: 105 meq/L (ref 96–112)
Creatinine, Ser: 0.84 mg/dL (ref 0.40–1.20)
GFR: 84.06 mL/min
Glucose, Bld: 83 mg/dL (ref 70–99)
Potassium: 4.1 meq/L (ref 3.5–5.1)
Sodium: 139 meq/L (ref 135–145)
Total Bilirubin: 0.7 mg/dL (ref 0.2–1.2)
Total Protein: 7.3 g/dL (ref 6.0–8.3)

## 2024-06-19 NOTE — Progress Notes (Signed)
 Margaret Rich is a 43 y.o. female with the following history as recorded in [employer]:  Patient Active Problem List   Diagnosis Date Noted   SUI (stress urinary incontinence, female) 06/09/2024   Bladder pain syndrome 06/09/2024   Pelvic pain 06/09/2024   Pelvic organ prolapse quantification stage 1 cystocele 06/09/2024   Elevated lipids 03/10/2024   Mild obstructive sleep apnea 07/12/2023   Poor compliance with CPAP treatment 07/12/2023   Somatic dysfunction of spine, sacral 08/03/2022   Joint hyperextensibility of multiple sites 07/12/2022   Gastroparesis 07/12/2022   Cervical high risk human papillomavirus (HPV) DNA test positive 06/06/2022   Non-restorative sleep 04/27/2022   Snoring 04/27/2022   Sleep walking 04/27/2022   Excessive daytime sleepiness 04/11/2022   Chronic fatigue 04/11/2022   Attention deficit hyperactivity disorder 11/30/2021   Carpal tunnel syndrome, right 01/18/2021   Biceps tendinitis of right upper extremity 01/18/2021   Anal fissure 09/24/2018   Anal pain 09/24/2018   Rectal bleeding 09/24/2018   Frequent headaches 07/31/2018   GERD (gastroesophageal reflux disease) 07/31/2018   High blood pressure 07/31/2018   Migraines 07/31/2018   Urine incontinence 07/31/2018   Urinary tract infection 07/31/2018    Current Outpatient Medications  Medication Sig Dispense Refill   Ascorbic Acid (VITAMIN C WITH ROSE HIPS) 500 MG tablet Take 500 mg by mouth daily.     buPROPion  (WELLBUTRIN  XL) 150 MG 24 hr tablet Take 1 tablet (150 mg total) by mouth daily. 90 tablet 3   cimetidine  (TAGAMET ) 200 MG tablet Take 1 tablet (200 mg total) by mouth at bedtime. 30 tablet 2   diclofenac  Sodium (VOLTAREN ) 1 % GEL Apply 2 g topically 4 (four) times daily. 150 g 0   magnesium gluconate (MAGONATE) 500 MG tablet Take 500 mg by mouth 2 (two) times daily.     multivitamin-lutein (OCUVITE-LUTEIN) CAPS capsule Take 1 capsule by mouth daily.     Omega-3 Fatty Acids (FISH OIL) 300  MG CAPS Take by mouth.     phenazopyridine  (PYRIDIUM ) 200 MG tablet Take 1 tablet (200 mg total) by mouth 3 (three) times daily as needed for pain. 10 tablet 0   SUMAtriptan  (IMITREX ) 25 MG tablet Take 1 tablet (25 mg total) by mouth every 2 (two) hours as needed for migraine. TAKE 1 TABLET BY MOUTH EVERY 2 HOURS FOR MIGRAINE. MAY REPEAT IN 2 HOURS IF HEADACHE PERSISTS OR RECURS. 10 tablet 2   ondansetron  (ZOFRAN ) 4 MG tablet Take 1 tablet (4 mg total) by mouth every 8 (eight) hours as needed for nausea or vomiting. (Patient not taking: Reported on 06/19/2024) 20 tablet 0   OVER THE COUNTER MEDICATION Take 1 tablet by mouth 2 (two) times daily. Migrelief + M (Patient not taking: Reported on 06/19/2024)     No current facility-administered medications for this visit.    Allergies: Latex  Past Medical History:  Diagnosis Date   Migraine    Sleep apnea     Past Surgical History:  Procedure Laterality Date   CARPAL TUNNEL RELEASE  12/2023   INTRAUTERINE DEVICE INSERTION      Family History  Problem Relation Age of Onset   Alcohol abuse Father    Cancer Father    Alcohol abuse Brother    Ovarian cancer Maternal Grandmother    Diabetes Maternal Grandmother    High blood pressure Maternal Grandmother    Alcohol abuse Maternal Grandfather    Heart disease Maternal Grandfather    High Cholesterol Maternal Grandfather  High blood pressure Maternal Grandfather    Alcohol abuse Paternal Grandmother    Alcohol abuse Paternal Grandfather    Cancer Paternal Grandfather    Colon cancer Neg Hx    Esophageal cancer Neg Hx    Inflammatory bowel disease Neg Hx    Liver disease Neg Hx    Pancreatic cancer Neg Hx    Rectal cancer Neg Hx    Stomach cancer Neg Hx    Renal cancer Neg Hx     Social History   Tobacco Use   Smoking status: Former    Current packs/day: 0.00    Types: Cigarettes    Start date: 05/19/2003    Quit date: 05/18/2008    Years since quitting: 16.0   Smokeless tobacco:  Never   Tobacco comments:    Social smoker;   Substance Use Topics   Alcohol use: Yes    Subjective:   I connected with Margaret Rich on 06/19/24 at  9:40 AM EDT by a video enabled telemedicine application and verified that I am speaking with the correct person using two identifiers.   I discussed the limitations of evaluation and management by telemedicine and the availability of in person appointments. The patient expressed understanding and agreed to proceed. Provider in office/ patient is at home; provider and patient are only 2 people on video call.   Patient has been having problems with chronic nausea for the past month; no vomiting or diarrhea; notes that symptoms do seem better with food; has stopped her Amitriptyline 3 weeks prior to symptom onset- was taking for interstitial cystitis;      Objective:  Vitals:   06/19/24 0937  Height: 5' 6 (1.676 m)    General: Well developed, well nourished, in no acute distress  Head: Normocephalic and atraumatic  Lungs: Respirations unlabored;  Neurologic: Alert and oriented; speech intact; face symmetrical; moves all extremities well; CNII-XII intact without focal deficit   Assessment:  1. Nausea     Plan:  ? Etiology; discussed with patient very difficult to assess the type of concerns on a virtual visit; will update abdominal ultrasound and labs; will most likely need to refer to GI; Patient may also be starting Topamax per her weight loss provider- discussed that if the nausea is related to chronic mild migraine, the Topamax could be beneficial for this as well; there seems to be confusion on the status of the Topamax however- patient will need to reach back out to her weight loss specialist for clarification.   No follow-ups on file.  Orders Placed This Encounter  Procedures   US  Abdomen Complete    Standing Status:   Future    Expiration Date:   06/19/2025    Reason for Exam (SYMPTOM  OR DIAGNOSIS REQUIRED):   nausea     Preferred imaging location?:   [facility]   CBC with Differential/Platelet    Standing Status:   Future    Expiration Date:   06/19/2025   Comp Met (CMET)    Standing Status:   Future    Expiration Date:   06/19/2025    Requested Prescriptions    No prescriptions requested or ordered in this encounter

## 2024-06-19 NOTE — Telephone Encounter (Signed)
 Called pt and left a VM informing pt her lab orders were placed for the [facility] lab.

## 2024-06-19 NOTE — Telephone Encounter (Signed)
 Copied from CRM [id number]. Topic: Clinical - Medical Advice >> Jun 19, 2024 11:44 AM [place] A wrote: Reason for CRM: Patient called because she was not sure where to have labs drawn. On the order it has [facility]. Please contact patient

## 2024-06-24 ENCOUNTER — Other Ambulatory Visit

## 2024-07-07 ENCOUNTER — Ambulatory Visit: Admitting: Orthopedic Surgery

## 2024-07-07 DIAGNOSIS — M654 Radial styloid tenosynovitis [de Quervain]: Secondary | ICD-10-CM

## 2024-07-07 MED ORDER — BETAMETHASONE SOD PHOS & ACET 6 (3-3) MG/ML IJ SUSP
6.0000 mg | INTRAMUSCULAR | Status: AC | PRN
  Administered 2024-07-07: 6 mg via INTRA_ARTICULAR

## 2024-07-07 MED ORDER — LIDOCAINE HCL 1 % IJ SOLN
1.0000 mL | INTRAMUSCULAR | Status: AC | PRN
  Administered 2024-07-07: 1 mL

## 2024-07-07 NOTE — Progress Notes (Signed)
 Margaret Rich - 43 y.o. female MRN [id number]  Date of birth: [birth year]  Office Visit Note: Visit Date: 07/07/2024 PCP: Jason Leita Repine, FNP Referred by: Jason Leita Repine,*  Subjective:  HPI: Margaret Rich is a 43 y.o. female who presents today for follow up 6 months status post right open carpal tunnel release.  She is very pleased with the results of her carpal tunnel surgery, numbness and tingling has resolved entirely.  She has resumed all activities as tolerated.  She does report some recent excessive milking of goats which has created some ongoing soreness and pain at the thumb [facility] region.  Pertinent ROS were reviewed with the patient and found to be negative unless otherwise specified above in HPI.   Assessment & Plan: Visit Diagnoses:  1. De Quervain's disease (radial styloid tenosynovitis)     Plan: Based on her examination today, she has some ongoing pain at the thumb [facility] region with hyperlaxity of her joints.  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  Prior x-rays demonstrate notable subluxation at the thumb [facility] interval also consistent with hypermobility.  She is also describing signs symptoms consistent with right wrist de Quervain's tenosynovitis.  We discussed the underlying etiology and pathophysiology of this condition.  We discussed treatment modalities range from conservative to surgical.  From a conservative standpoint, we discussed ongoing therapy, splinting, activity modification, anti-inflammatory medication and potential cortisone injection.  Understanding the above, she elected to proceed with cortisone injection today, she was also fitted with a Comfort Cool brace for symptom relief.  She will return to me in approximately 6 weeks or as needed for repeat check and examination.   Follow-up:  No follow-ups on file.   Meds & Orders: No orders of the defined types were placed in this encounter.   Orders Placed This Encounter  Procedures   Hand/UE Inj: R extensor compartment 1     Procedures: Hand/UE Inj: R extensor compartment 1 for de Quervain's tenosynovitis on 07/07/2024 8:26 PM Indications: pain Details: 22 G needle Medications: 1 mL lidocaine  1 %; 6 mg betamethasone  acetate-betamethasone  sodium phosphate 6 (3-3) MG/ML Outcome: tolerated well, no immediate complications Procedure, treatment alternatives, risks and benefits explained, specific risks discussed. Consent was given by the patient.           Objective:   Vital Signs: LMP  (LMP Unknown)   Ortho Exam General: Patient is well appearing and in no distress.  Skin and Muscle: No skin changes are apparent to upper extremities.  Muscle bulk and contour normal, no signs of atrophy.     Range of Motion and Palpation Tests: Mobility is full about the elbows with flexion and extension. Significant hyperlaxity is noted to bilateral hands.  Thumbs are able to reach forearms with the wrist in flexion, hypermobility of the small finger with extension of the MPs past 90 degrees bilaterally, knee hyperextension and elbow hyperextension is visualized bilaterally.  No cords or nodules are palpated.  No triggering is observed.   Finklestein test is positive right side, significant pain with associated swelling and tenderness.  Tenderness elicited over the [facility] interval as well.  Neurologic, Vascular, Motor: Sensation is intact to light touch in the median/radial/ulnar distributions.   Fingers pink and well perfused.  Capillary refill is brisk.     Imaging: No results found.   Thierno Hun ([place])  Cherilynn Schomburg, M.D. [facility], Hand Surgery

## 2024-07-08 ENCOUNTER — Ambulatory Visit: Admitting: Nurse Practitioner

## 2024-07-08 ENCOUNTER — Other Ambulatory Visit: Payer: Self-pay

## 2024-07-08 ENCOUNTER — Encounter: Payer: Self-pay | Admitting: Physical Therapy

## 2024-07-08 ENCOUNTER — Ambulatory Visit: Attending: Obstetrics | Admitting: Physical Therapy

## 2024-07-08 ENCOUNTER — Encounter: Payer: Self-pay | Admitting: Obstetrics

## 2024-07-08 DIAGNOSIS — R279 Unspecified lack of coordination: Secondary | ICD-10-CM | POA: Diagnosis present

## 2024-07-08 DIAGNOSIS — M6281 Muscle weakness (generalized): Secondary | ICD-10-CM | POA: Diagnosis present

## 2024-07-08 DIAGNOSIS — R293 Abnormal posture: Secondary | ICD-10-CM | POA: Diagnosis present

## 2024-07-08 NOTE — Therapy (Signed)
 OUTPATIENT PHYSICAL THERAPY FEMALE PELVIC EVALUATION   Patient Name: Margaret Rich MRN: [id number] DOB:Jul 12, 1981, 43 y.o., female Today's Date: 07/08/2024  END OF SESSION:  PT End of Session - 07/08/24 1338     Visit Number 1    Number of Visits 8    Date for PT Re-Evaluation 08/05/24    Authorization Type [employer]    PT Start Time 1100    PT Stop Time 1145    PT Time Calculation (min) 45 min    Activity Tolerance Patient tolerated treatment well    Behavior During Therapy [facility] for tasks assessed/performed         Past Medical History:  Diagnosis Date   Migraine    Sleep apnea    Past Surgical History:  Procedure Laterality Date   CARPAL TUNNEL RELEASE  12/2023   INTRAUTERINE DEVICE INSERTION     Patient Active Problem List   Diagnosis Date Noted   SUI (stress urinary incontinence, female) 06/09/2024   Bladder pain syndrome 06/09/2024   Pelvic pain 06/09/2024   Pelvic organ prolapse quantification stage 1 cystocele 06/09/2024   Elevated lipids 03/10/2024   Mild obstructive sleep apnea 07/12/2023   Poor compliance with CPAP treatment 07/12/2023   Somatic dysfunction of spine, sacral 08/03/2022   Joint hyperextensibility of multiple sites 07/12/2022   Gastroparesis 07/12/2022   Cervical high risk human papillomavirus (HPV) DNA test positive 06/06/2022   Non-restorative sleep 04/27/2022   Snoring 04/27/2022   Sleep walking 04/27/2022   Excessive daytime sleepiness 04/11/2022   Chronic fatigue 04/11/2022   Attention deficit hyperactivity disorder 11/30/2021   Carpal tunnel syndrome, right 01/18/2021   Biceps tendinitis of right upper extremity 01/18/2021   Anal fissure 09/24/2018   Anal pain 09/24/2018   Rectal bleeding 09/24/2018   Frequent headaches 07/31/2018   GERD (gastroesophageal reflux disease) 07/31/2018   High blood pressure 07/31/2018   Migraines 07/31/2018   Urine incontinence 07/31/2018   Urinary tract infection 07/31/2018   PCP:    Jason Leita Repine, FNP   REFERRING PROVIDER: Guadlupe Lianne DASEN, MD   REFERRING DIAG: N39.3 (ICD-10-CM) - SUI (stress urinary incontinence, female) N30.10 (ICD-10-CM) - Bladder pain syndrome  THERAPY DIAG:  Muscle weakness (generalized)  Unspecified lack of coordination  Abnormal posture  Rationale for Evaluation and Treatment: Rehabilitation  ONSET DATE: urinary urgency 10 years ago  SUBJECTIVE:                                                                                                                                                                                           SUBJECTIVE STATEMENT: Patient reports to PFPT  with pelvic pain that is general in nature. She also deals with urinary urgency - she has tried medication for this and it didn't help. When she stands up, she feels an intense urge to void. She does not leak urine.  PAIN:  Are you having pain? Yes NPRS scale: 4-5/10 current, 6-7/10 at worst  Pain location: Deep, Bilateral, and Anterior  Pain type: aching Pain description: intermittent   Aggravating factors: sexual arousal (blood flow to this area), being active in general  Relieving factors: NSAIDs, heat  PRECAUTIONS: None  RED FLAGS: None   WEIGHT BEARING RESTRICTIONS: No  FALLS:  Has patient fallen in last 6 months? 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, taking care of 2 kids, has a lot of hypermobility so swimming seems to feel very good   PLOF: Independent  PATIENT GOALS: to decrease pain, decrease the high sense of urgency   PERTINENT HISTORY:  Sexual abuse: No  BOWEL MOVEMENT: Pain with bowel movement: No Type of bowel movement:Type (Bristol Stool Scale) 4-5, Frequency 1-2x/day , Strain no, and Splinting no Fully empty rectum: No Leakage: No Pads: No Fiber supplement/laxative Yes - psyllium husk   URINATION: Pain with urination: No Fully empty bladder: No Stream:  depends  Urgency: Yes  Frequency: every 2 hours or so, gets up 1x to void at night  Leakage: none Pads: No  INTERCOURSE:  Ability to have vaginal penetration Yes  Pain with intercourse: none DrynessNo Climax: yes Marinoff Scale: 0/3  PREGNANCY: Vaginal deliveries 2 Tearing Yes: tearing with first (bilaterally at the urethra), minor tearing with second that healed naturally  Episiotomy No C-section deliveries 0 Currently pregnant No  PROLAPSE: None  OBJECTIVE:  Note: Objective measures were completed at Evaluation unless otherwise noted.  PATIENT SURVEYS:  PFIQ-7: 25  COGNITION: Overall cognitive status: Within functional limits for tasks assessed     SENSATION: Light touch: Appears intact  LUMBAR SPECIAL TESTS:  Single leg stance test: Positive  FUNCTIONAL TESTS:  Squat: general lumbopelvic hypermobility with squat test, weight shift laterally to the right   GAIT: Assistive device utilized: None Comments: mild trendelenburg gait pattern with ambulation   POSTURE: rounded shoulders and forward head  LUMBARAROM/PROM: within normal limits for all motions bilaterally (hypermobility present)  LOWER EXTREMITY ROM: within normal limits for all motions bilaterally (hypermobility present)  LOWER EXTREMITY MMT: 4/5 bilateral knees and hips grossly   PALPATION:   General: no significant tenderness to palpation of bilateral adductors or hip flexors   Pelvic Alignment: within normal limits   Abdominal: upper chest breathing, abdominal bracing, and decreased lower rib excursion present at rest                External Perineal Exam: sufficient moisture levels present with sufficient clitoral hood mobility                              Internal Pelvic Floor: Patient fully consents to today's internal examination. She demonstrates a strong contraction with stiffness throughout the musculature that is limiting full range of motion of the pelvic floor. Combined with an  exhale, her contraction strength was improved and more coordinated with her breath. No significant tension in either side or layer of pelvic floor musculature. 0/10 pain upon end of examination.  Patient confirms identification and approves PT to assess internal pelvic floor and treatment  Yes No emotional/communication barriers or cognitive limitation. Patient is motivated to learn. Patient understands and agrees with treatment goals and plan. PT explains patient will be examined in standing, sitting, and lying down to see how their muscles and joints work. When they are ready, they will be asked to remove their underwear so PT can examine their perineum. The patient is also given the option of providing their own chaperone as one is not provided in our facility. The patient also has the right and is explained the right to defer or refuse any part of the evaluation or treatment including the internal exam. With the patient's consent, PT will use one gloved finger to gently assess the muscles of the pelvic floor, seeing how well it contracts and relaxes and if there is muscle symmetry. After, the patient will get dressed and PT and patient will discuss exam findings and plan of care. PT and patient discuss plan of care, schedule, attendance policy and HEP activities.  PELVIC MMT:    MMT eval  Vaginal 4/5, 10 quick flicks, 10 second hold  Internal Anal Sphincter   External Anal Sphincter   Puborectalis   Diastasis Recti   (Blank rows = not tested)       TONE: Within normal limits bilaterally in superficial and deep pelvic floor musculature   PROLAPSE: None present in hooklying   TODAY'S TREATMENT:                                                                                                                              DATE:   EVAL 07/08/24: Examination completed, findings reviewed, pt educated on POC, HEP, and self care. Pt motivated to participate in PT and agreeable to attempt  recommendations.   Neuro re-ed: Hooklying diaphragmatic breathing + pelvic floor lengthening with inhalation and shortening with exhalation 2x10  Supine Butterfly Groin Stretch  - 1 x daily - 7 x weekly - 2 sets - hold - Supine Single Knee to Chest Stretch  - 1 x daily - 7 x weekly - 2 sets - hold - Supine Lower Trunk Rotation  - 1 x daily - 7 x weekly - 2 sets - 10 reps - Diaphragmatic Breathing in Child's Pose with Pelvic Floor Relaxation  - 1 x daily - 7 x weekly - 2 sets - hold Self care: Relative anatomy and the connection between the diaphragm and pelvic floor, water intake vs bladder irritants  PATIENT EDUCATION:  Education details: Relative anatomy and the connection between the diaphragm and pelvic floor, water intake vs bladder irritants Person educated: Patient Education method: Explanation, Demonstration, Tactile cues, Verbal cues, and Handouts Education comprehension: verbalized understanding, returned demonstration, verbal cues required, tactile cues required, and needs further education  HOME EXERCISE PROGRAM: Access Code: [email or web address] URL: [email or web address] Date: 07/08/2024 Prepared by: Celena Domino  Exercises - Supine Pelvic Floor Contraction  - 1 x daily - 7 x weekly - 2 sets -  10 reps - Supine Butterfly Groin Stretch  - 1 x daily - 7 x weekly - 2 sets - hold - Supine Single Knee to Chest Stretch  - 1 x daily - 7 x weekly - 2 sets - hold - Supine Lower Trunk Rotation  - 1 x daily - 7 x weekly - 2 sets - 10 reps - Diaphragmatic Breathing in Child's Pose with Pelvic Floor Relaxation  - 1 x daily - 7 x weekly - 2 sets - hold  ASSESSMENT:  CLINICAL IMPRESSION: Patient is a 43 y.o. female  who was seen today for physical therapy evaluation and treatment for pelvic pain and urinary urgency. She reports to PFPT with pelvic pain that is general in nature (4-5/10 at rest currently). She also deals with urinary urgency - she has tried  medication for this and it didn't help. When she stands up, she feels an intense urge to void. She does not leak urine. General lumbopelvic hypermobility present with mild hip weakness during today's lumbopelvic assessment. She fully consented to today's internal examination. She demonstrates a strong contraction with stiffness throughout the musculature that is limiting full range of motion of the pelvic floor. Combined with an exhale, her contraction strength was improved and more coordinated with her breath. No significant tension in either side or layer of pelvic floor musculature. 0/10 pain upon end of examination. Overall, patient tolerated session well and Pt would benefit from additional PT to further address deficits.    OBJECTIVE IMPAIRMENTS: decreased coordination, decreased endurance, decreased mobility, decreased ROM, decreased strength, and pain.   ACTIVITY LIMITATIONS: carrying, lifting, bending, sitting, standing, squatting, stairs, transfers, and continence  PARTICIPATION LIMITATIONS: community activity, occupation, and yard work  PERSONAL FACTORS: Past/current experiences and Time since onset of injury/illness/exacerbation are also affecting patient's functional outcome.   REHAB POTENTIAL: Good  CLINICAL DECISION MAKING: Stable/uncomplicated  EVALUATION COMPLEXITY: Low   GOALS: Goals reviewed with patient? Yes  SHORT TERM GOALS: Target date: 08/05/2024  Pt will be independent with HEP.  Baseline: Goal status: INITIAL  2.  Pt will be independent with diaphragmatic breathing and down training activities in order to improve pelvic floor relaxation. Baseline:  Goal status: INITIAL  3.  Pt will be able to correctly perform diaphragmatic breathing and appropriate pressure management in order to prevent worsening vaginal wall laxity and improve pelvic floor A/ROM.  Baseline:  Goal status: INITIAL  4.  Pt will be able to teach back and utilize urge suppression technique in  order to help reduce number of trips to the bathroom.   Baseline:  Goal status: INITIAL  5.  Pt will report 50% reduction of pain due to improvements in posture, strength, and muscle length in pelvic floor musculature to improve quality of life and decrease functional limitations. Baseline:  Goal status: INITIAL  LONG TERM GOALS: Target date: 01/08/2025  Pt will be independent with advanced HEP.  Baseline:  Goal status: INITIAL  2.  Pt to demonstrate improved coordination of pelvic floor and breathing mechanics with 10# squat with appropriate synergistic patterns to decrease pain and leakage at least 75% of the time for improved ability to complete a 30 minute workout with strain at pelvic floor and symptoms.   Baseline:  Goal status: INITIAL  3.  Pt will be independent with the knack, urge suppression technique, and double voiding in order to improve bladder habits and decrease urinary incontinence.   Baseline:  Goal status: INITIAL  4.  Pt will  have 75% less urgency due to bladder retraining and strengthening to decrease trips to bathroom during daytime. Baseline:  Goal status: INITIAL  5.  Pt to demonstrate at least 4+/5 bil hip strength for improved pelvic stability and functional squats without urgency/pain/leakage.  Baseline:  Goal status: INITIAL  PLAN:  PT FREQUENCY: 1-2x/week  PT DURATION: 10 weeks  PLANNED INTERVENTIONS: 97110-Therapeutic exercises, 97530- Therapeutic activity, 97112- Neuromuscular re-education, 97535- Self Care, [id number]- Manual therapy, Patient/Family education, Taping, Joint mobilization, Spinal mobilization, Scar mobilization, Cryotherapy, and Moist heat  PLAN FOR NEXT SESSION: continued pelvic floor AROM in seated, downtraining stretches, introduce hip and core strengthening, abdominal massage  Celena JAYSON Domino, PT 07/08/2024, 1:40 PM

## 2024-07-15 ENCOUNTER — Ambulatory Visit: Admitting: Nurse Practitioner

## 2024-07-15 ENCOUNTER — Ambulatory Visit: Admitting: Obstetrics and Gynecology

## 2024-07-15 ENCOUNTER — Encounter: Payer: Self-pay | Admitting: Obstetrics and Gynecology

## 2024-07-15 ENCOUNTER — Encounter: Payer: Self-pay | Admitting: Nurse Practitioner

## 2024-07-15 VITALS — BP 111/77 | HR 93

## 2024-07-15 VITALS — BP 119/78 | HR 77 | Temp 98.5°F | Ht 66.0 in | Wt 177.0 lb

## 2024-07-15 DIAGNOSIS — Z6828 Body mass index (BMI) 28.0-28.9, adult: Secondary | ICD-10-CM

## 2024-07-15 DIAGNOSIS — R319 Hematuria, unspecified: Secondary | ICD-10-CM | POA: Diagnosis not present

## 2024-07-15 DIAGNOSIS — N3281 Overactive bladder: Secondary | ICD-10-CM | POA: Diagnosis not present

## 2024-07-15 DIAGNOSIS — E669 Obesity, unspecified: Secondary | ICD-10-CM

## 2024-07-15 DIAGNOSIS — N3010 Interstitial cystitis (chronic) without hematuria: Secondary | ICD-10-CM

## 2024-07-15 DIAGNOSIS — R638 Other symptoms and signs concerning food and fluid intake: Secondary | ICD-10-CM | POA: Diagnosis not present

## 2024-07-15 LAB — POCT URINALYSIS DIP (CLINITEK)
Bilirubin, UA: NEGATIVE
Glucose, UA: NEGATIVE mg/dL
Ketones, POC UA: NEGATIVE mg/dL
Leukocytes, UA: NEGATIVE
Nitrite, UA: NEGATIVE
POC PROTEIN,UA: NEGATIVE
Spec Grav, UA: 1.02 (ref 1.010–1.025)
Urobilinogen, UA: 0.2 U/dL
pH, UA: 6.5 (ref 5.0–8.0)

## 2024-07-15 MED ORDER — HEPARIN SODIUM (PORCINE) 10000 UNIT/ML IJ SOLN
10000.0000 [IU] | Freq: Once | INTRAMUSCULAR | Status: AC
  Administered 2024-07-15: 10000 [IU] via INTRAVESICAL

## 2024-07-15 MED ORDER — SODIUM BICARBONATE 8.4 % IV SOLN
5.0000 mL | Freq: Once | INTRAVENOUS | Status: AC
  Administered 2024-07-15: 5 mL

## 2024-07-15 MED ORDER — TROSPIUM CHLORIDE 20 MG PO TABS: 20.0000 mg | ORAL_TABLET | Freq: Two times a day (BID) | ORAL | 5 refills | Status: DC

## 2024-07-15 MED ORDER — LIDOCAINE HCL 2 % IJ SOLN
20.0000 mL | Freq: Once | INTRAMUSCULAR | Status: AC
  Administered 2024-07-15: 400 mg

## 2024-07-15 MED ORDER — BUPIVACAINE HCL 0.25 % IJ SOLN
20.0000 mL | Freq: Once | INTRAMUSCULAR | Status: AC
  Administered 2024-07-15: 20 mL

## 2024-07-15 NOTE — Progress Notes (Signed)
 Office: [phone]  /  Fax: [phone]  WEIGHT SUMMARY AND BIOMETRICS  Weight Lost Since Last Visit: 3lb  Weight Gained Since Last Visit: 0lb   Vitals Temp: 98.5 F (36.9 C) BP: 119/78 Pulse Rate: 77 SpO2: 98 %   Anthropometric Measurements Height: 5' 6 (1.676 m) Weight: 177 lb (80.3 kg) BMI (Calculated): 28.58 Weight at Last Visit: 180lb Weight Lost Since Last Visit: 3lb Weight Gained Since Last Visit: 0lb Starting Weight: 191lb Total Weight Loss (lbs): 14 lb (6.35 kg)   Body Composition  Body Fat %: 35.1 % Fat Mass (lbs): 62.4 lbs Muscle Mass (lbs): 109.6 lbs Total Body Water (lbs): 78.6 lbs Visceral Fat Rating : 7   Other Clinical Data Fasting: Yes Labs: No Today's Visit #: 7 Starting Date: 03/06/24     HPI  Chief Complaint: OBESITY  Margaret Rich is here to discuss her progress with her obesity treatment plan. She is on the keeping a food journal and adhering to recommended goals of 1500-1600 calories and 45 protein and states she is following her eating plan approximately 75 % of the time. She states she is exercising 0 minutes 0 days per week.   Interval History:  Since last office visit she has lost 3 pounds.  She is eating noon/1pm-10pm.  She is unsure of calories intake-thinking <1600 and 80-85 grams of protein.  She is drinking water daily and occ soda.   She hasn't been exercising.  No complaints today of polyphagia or cravings.      Bioimpedance  Body fat % increased from 34.7% to 35.1% Muscle mass decreased 2 lbs  Pharmacotherapy for weight loss: She is not currently taking medications  for medical weight loss.    Previous pharmacotherapy for medical weight loss:  None   Bariatric surgery:  Patient has not had bariatric surgery.     PHYSICAL EXAM:  Blood pressure 119/78, pulse 77, temperature 98.5 F (36.9 C), height 5' 6 (1.676 m), weight 177 lb (80.3 kg), SpO2 98%. Body mass index is 28.57 kg/m.  General: She is overweight,  cooperative, alert, well developed, and in no acute distress. PSYCH: Has normal mood, affect and thought process.   Extremities: No edema.  Neurologic: No gross sensory or motor deficits. No tremors or fasciculations noted.    DIAGNOSTIC DATA REVIEWED:  BMET    Component Value Date/Time   NA 139 06/19/2024 1235   NA 138 03/06/2024 0844   K 4.1 06/19/2024 1235   CL 105 06/19/2024 1235   CO2 27 06/19/2024 1235   GLUCOSE 83 06/19/2024 1235   BUN 14 06/19/2024 1235   BUN 8 03/06/2024 0844   CREATININE 0.84 06/19/2024 1235   CALCIUM 9.3 06/19/2024 1235   Lab Results  Component Value Date   HGBA1C 5.6 03/06/2024   HGBA1C 5.5 05/11/2020   Lab Results  Component Value Date   INSULIN  21.0 03/06/2024   Lab Results  Component Value Date   TSH 2.68 08/24/2023   CBC    Component Value Date/Time   WBC 8.7 06/19/2024 1235   RBC 4.80 06/19/2024 1235   HGB 13.4 06/19/2024 1235   HCT 40.2 06/19/2024 1235   PLT 220.0 06/19/2024 1235   MCV 83.8 06/19/2024 1235   MCHC 33.4 06/19/2024 1235   RDW 15.5 06/19/2024 1235   Iron Studies No results found for: IRON, TIBC, FERRITIN, IRONPCTSAT Lipid Panel     Component Value Date/Time   CHOL 219 (H) 03/06/2024 0844   TRIG 150 (H) 03/06/2024 9155  HDL 55 03/06/2024 0844   LDLCALC 137 (H) 03/06/2024 0844   Hepatic Function Panel     Component Value Date/Time   PROT 7.3 06/19/2024 1235   PROT 7.3 03/06/2024 0844   ALBUMIN 4.8 06/19/2024 1235   ALBUMIN 4.7 03/06/2024 0844   AST 15 06/19/2024 1235   ALT 14 06/19/2024 1235   ALKPHOS 64 06/19/2024 1235   BILITOT 0.7 06/19/2024 1235   BILITOT 0.4 03/06/2024 0844      Component Value Date/Time   TSH 2.68 08/24/2023 1118   Nutritional Lab Results  Component Value Date   VD25OH 37.3 03/06/2024   VD25OH 29.94 (L) 05/11/2020     ASSESSMENT AND PLAN  TREATMENT PLAN FOR OBESITY:  Recommended Dietary Goals  Margaret Rich is currently in the action stage of change. As such,  her goal is to continue weight management plan. She has agreed to keeping a food journal and adhering to recommended goals of 1500-1600 calories and 90+ grams of protein.  Behavioral Intervention  We discussed the following Behavioral Modification Strategies today: increasing lean protein intake to established goals, increasing vegetables, increasing fiber rich foods, increasing water intake , work on meal planning and preparation, reading food labels , keeping healthy foods at home, and continue to work on maintaining a reduced calorie state, getting the recommended amount of protein, incorporating whole foods, making healthy choices, staying well hydrated and practicing mindfulness when eating..  Additional resources provided today: NA  Recommended Physical Activity Goals  Margaret Rich has been advised to work up to 150 minutes of moderate intensity aerobic activity a week and strengthening exercises 2-3 times per week for cardiovascular health, weight loss maintenance and preservation of muscle mass.   She has agreed to Think about enjoyable ways to increase daily physical activity and overcoming barriers to exercise, Increase physical activity in their day and reduce sedentary time (increase NEAT)., and Work on scheduling and tracking physical activity.    ASSOCIATED CONDITIONS ADDRESSED TODAY  Action/Plan  Abnormal craving Currently doing well.  Will continue to monitor  Generalized obesity  BMI 28.0-28.9,adult     Goals Increase protein and water intake Discussed resistance training again today    Return in about 4 weeks (around 08/12/2024).Margaret Rich She was informed of the importance of frequent follow up visits to maximize her success with intensive lifestyle modifications for her multiple health conditions.   ATTESTASTION STATEMENTS:  Reviewed by clinician on day of visit: allergies, medications, problem list, medical history, surgical history, family history, social history, and  previous encounter notes.   Time spent on visit including pre-visit chart review and post-visit care and charting was 20 minutes.    Margaret Rich. Margaret Rich Single FNP-C

## 2024-07-15 NOTE — Progress Notes (Signed)
 [facility] Return Visit  SUBJECTIVE  History of Present Illness: Margaret Rich is a 43 y.o. female seen in follow-up for Bladder pain and irritation. Plan at last visit was start cimetidine  and pyridium . Patient denies improvement of symptoms and has been taking  tylenol  and ibuprofen daily and feels significant urgency and frequency. She has had an initial evaluation with PT.     Past Medical History: Patient  has a past medical history of Migraine and Sleep apnea.   Past Surgical History: She  has a past surgical history that includes Carpal tunnel release (12/2023) and Intrauterine device insertion.   Medications: She has a current medication list which includes the following prescription(s): vitamin c with rose hips, bupropion , cimetidine , diclofenac  sodium, magnesium gluconate, multivitamin-lutein, fish oil, ondansetron , phenazopyridine , sumatriptan , trospium , and OVER THE COUNTER MEDICATION.   Allergies: Patient is allergic to latex.   Social History: Patient  reports that she quit smoking about 16 years ago. Her smoking use included cigarettes. She started smoking about 21 years ago. She has never used smokeless tobacco. She reports current alcohol use. She reports that she does not use drugs.     OBJECTIVE     Physical Exam: Vitals:   07/15/24 1540  BP: 111/77  Pulse: 93   Gen: No apparent distress, A&O x 3.  Detailed Urogynecologic Evaluation:  No external concerns.   Bladder Instillation: The patient was identified and verbally consented for the procedure.  The urethra was prepped with Betadine x 3. A 16 Fr foley catheter was inserted the bladder and drained for _25 cc. The foley was then attached to a 60 mL syringe with the plunger removed.  The medication was slowly poured into the bladder via the syringe and foley.  The medication consisted of: 20ml of Lidocaine  2%, 20mL of Bupivicaine 0.25%, 10,000 units/mL Heparin , 5mL Sodium Bicarbonate   8.4%.  The foley was removed and the patient was asked to hold the liquids in her bladder for 30-60 minutes if possible.       ASSESSMENT AND PLAN    Ms. Margaret Rich is a 43 y.o. with:  1. OAB (overactive bladder)   2. Bladder pain syndrome   3. Hematuria, unspecified type     During patient's bladder instillation, she had significant noted DO's. Will start patient on OAB medications to see if this assists in her irritation. Trospium  20mg  x2 daily prescribed. Encouraged her to start with 1 time daily and can increase if needed.  Bladder instillation done today. She will let me know Thursday if she would like these scheduled weekly.  Will send for micro.   Patient to inform me if she would like to continue bladder instillations. If she has improvement with Trospium , we may want to consider Urodynamics for a full picture and possible look more towards botox.     Margaret Rich G Margaret Patricelli, NP

## 2024-07-15 NOTE — Patient Instructions (Signed)
 I want you to try Trospium  20mg  on Thursday and let me know if the bladder instillation worked.

## 2024-07-22 ENCOUNTER — Encounter: Payer: Self-pay | Admitting: Obstetrics and Gynecology

## 2024-08-06 ENCOUNTER — Other Ambulatory Visit: Payer: Self-pay | Admitting: Family

## 2024-08-07 ENCOUNTER — Encounter

## 2024-08-08 ENCOUNTER — Other Ambulatory Visit: Payer: Self-pay | Admitting: Obstetrics and Gynecology

## 2024-08-08 DIAGNOSIS — N3281 Overactive bladder: Secondary | ICD-10-CM

## 2024-08-08 DIAGNOSIS — N3010 Interstitial cystitis (chronic) without hematuria: Secondary | ICD-10-CM

## 2024-08-08 MED ORDER — MIRABEGRON ER 25 MG PO TB24: 25.0000 mg | ORAL_TABLET | Freq: Every day | ORAL | 5 refills | Status: DC

## 2024-08-13 ENCOUNTER — Ambulatory Visit: Admitting: Nurse Practitioner

## 2024-08-14 ENCOUNTER — Ambulatory Visit: Attending: Obstetrics

## 2024-08-14 ENCOUNTER — Ambulatory Visit: Admitting: Nurse Practitioner

## 2024-08-14 ENCOUNTER — Encounter: Payer: Self-pay | Admitting: Nurse Practitioner

## 2024-08-14 VITALS — BP 106/73 | HR 76 | Temp 98.4°F | Ht 66.0 in | Wt 176.0 lb

## 2024-08-14 DIAGNOSIS — Z6828 Body mass index (BMI) 28.0-28.9, adult: Secondary | ICD-10-CM

## 2024-08-14 DIAGNOSIS — E7800 Pure hypercholesterolemia, unspecified: Secondary | ICD-10-CM

## 2024-08-14 DIAGNOSIS — E669 Obesity, unspecified: Secondary | ICD-10-CM | POA: Diagnosis not present

## 2024-08-14 NOTE — Progress Notes (Signed)
 Office: [phone]  /  Fax: [phone]  WEIGHT SUMMARY AND BIOMETRICS  Weight Lost Since Last Visit: 1lb  Weight Gained Since Last Visit: 0lb   Vitals Temp: 98.4 F (36.9 C) BP: 106/73 Pulse Rate: 76 SpO2: 99 %   Anthropometric Measurements Height: 5' 6 (1.676 m) Weight: 176 lb (79.8 kg) BMI (Calculated): 28.42 Weight at Last Visit: 177lb Weight Lost Since Last Visit: 1lb Weight Gained Since Last Visit: 0lb Starting Weight: 191lb Total Weight Loss (lbs): 15 lb (6.804 kg)   Body Composition  Body Fat %: 35.3 % Fat Mass (lbs): 62.4 lbs Muscle Mass (lbs): 108.6 lbs Total Body Water (lbs): 78.2 lbs Visceral Fat Rating : 7   Other Clinical Data Fasting: No Labs: No Today's Visit #: 8 Starting Date: 03/06/24     HPI  Chief Complaint: OBESITY  Margaret Rich is here to discuss her progress with her obesity treatment plan. She is on the keeping a food journal and adhering to recommended goals of 1500-1600 calories and 45 protein and states she is following her eating plan approximately 50 % of the time. She states she is exercising 0 minutes 0 days per week.   Interval History:  Since last office visit she has lost 1 pound.  She is not currently tracking and would like to restart. She is following IF 1-10pm. She is active working on a farm. Her kids restarted back to school and she plans to start going back to the gym-swimming.     Pharmacotherapy for weight loss: She is not currently taking medications  for medical weight loss.     Previous pharmacotherapy for medical weight loss:  None   Bariatric surgery:  Patient has not had bariatric surgery.   Hyperlipidemia Medication(s): Fish oil. Denies side effects.   Family history: Maternal grandfather  Lab Results  Component Value Date   CHOL 219 (H) 03/06/2024   HDL 55 03/06/2024   LDLCALC 137 (H) 03/06/2024   TRIG 150 (H) 03/06/2024   Lab Results  Component Value Date   ALT 14 06/19/2024   AST 15  06/19/2024   ALKPHOS 64 06/19/2024   BILITOT 0.7 06/19/2024   The 10-year ASCVD risk score (Arnett DK, et al., 2019) is: 0.7%   Values used to calculate the score:     Age: 43 years     Clincally relevant sex: Female     Is Non-Hispanic African American: No     Diabetic: No     Tobacco smoker: No     Systolic Blood Pressure: 106 mmHg     Is BP treated: No     HDL Cholesterol: 55 mg/dL     Total Cholesterol: 219 mg/dL     PHYSICAL EXAM:  Blood pressure 106/73, pulse 76, temperature 98.4 F (36.9 C), height 5' 6 (1.676 m), weight 176 lb (79.8 kg), SpO2 99%. Body mass index is 28.41 kg/m.  General: She is overweight, cooperative, alert, well developed, and in no acute distress. PSYCH: Has normal mood, affect and thought process.   Extremities: No edema.  Neurologic: No gross sensory or motor deficits. No tremors or fasciculations noted.    DIAGNOSTIC DATA REVIEWED:  BMET    Component Value Date/Time   NA 139 06/19/2024 1235   NA 138 03/06/2024 0844   K 4.1 06/19/2024 1235   CL 105 06/19/2024 1235   CO2 27 06/19/2024 1235   GLUCOSE 83 06/19/2024 1235   BUN 14 06/19/2024 1235   BUN 8 03/06/2024 0844  CREATININE 0.84 06/19/2024 1235   CALCIUM 9.3 06/19/2024 1235   Lab Results  Component Value Date   HGBA1C 5.6 03/06/2024   HGBA1C 5.5 05/11/2020   Lab Results  Component Value Date   INSULIN  21.0 03/06/2024   Lab Results  Component Value Date   TSH 2.68 08/24/2023   CBC    Component Value Date/Time   WBC 8.7 06/19/2024 1235   RBC 4.80 06/19/2024 1235   HGB 13.4 06/19/2024 1235   HCT 40.2 06/19/2024 1235   PLT 220.0 06/19/2024 1235   MCV 83.8 06/19/2024 1235   MCHC 33.4 06/19/2024 1235   RDW 15.5 06/19/2024 1235   Iron Studies No results found for: IRON, TIBC, FERRITIN, IRONPCTSAT Lipid Panel     Component Value Date/Time   CHOL 219 (H) 03/06/2024 0844   TRIG 150 (H) 03/06/2024 0844   HDL 55 03/06/2024 0844   LDLCALC 137 (H) 03/06/2024  0844   Hepatic Function Panel     Component Value Date/Time   PROT 7.3 06/19/2024 1235   PROT 7.3 03/06/2024 0844   ALBUMIN 4.8 06/19/2024 1235   ALBUMIN 4.7 03/06/2024 0844   AST 15 06/19/2024 1235   ALT 14 06/19/2024 1235   ALKPHOS 64 06/19/2024 1235   BILITOT 0.7 06/19/2024 1235   BILITOT 0.4 03/06/2024 0844      Component Value Date/Time   TSH 2.68 08/24/2023 1118   Nutritional Lab Results  Component Value Date   VD25OH 37.3 03/06/2024   VD25OH 29.94 (L) 05/11/2020     ASSESSMENT AND PLAN  TREATMENT PLAN FOR OBESITY:  Recommended Dietary Goals  Margaret Rich is currently in the action stage of change. As such, her goal is to continue weight management plan. She has agreed to keeping a food journal and adhering to recommended goals of 1500-1600 calories and 90 protein.  Behavioral Intervention  We discussed the following Behavioral Modification Strategies today: increasing lean protein intake to established goals, decreasing simple carbohydrates , increasing vegetables, increasing fiber rich foods, increasing water intake , work on meal planning and preparation, work on tracking and journaling calories using tracking application, continue to work on maintaining a reduced calorie state, getting the recommended amount of protein, incorporating whole foods, making healthy choices, staying well hydrated and practicing mindfulness when eating., and increase protein intake, fibrous foods (25 grams per day for women, 30 grams for men) and water to improve satiety and decrease hunger signals. .  Additional resources provided today: NA  Recommended Physical Activity Goals  Margaret Rich has been advised to work up to 150 minutes of moderate intensity aerobic activity a week and strengthening exercises 2-3 times per week for cardiovascular health, weight loss maintenance and preservation of muscle mass.   She has agreed to Think about enjoyable ways to increase daily physical activity and  overcoming barriers to exercise, Increase physical activity in their day and reduce sedentary time (increase NEAT)., and Work on scheduling and tracking physical activity.     ASSOCIATED CONDITIONS ADDRESSED TODAY  Action/Plan  Elevated cholesterol Cardiovascular risk and specific lipid/LDL goals reviewed.  We discussed several lifestyle modifications today and Margaret Rich will continue to work on diet, exercise and weight loss efforts. Orders and follow up as documented in patient record.   Counseling Intensive lifestyle modifications are the first line treatment for this issue. Dietary changes: Increase soluble fiber. Decrease simple carbohydrates. Exercise changes: Moderate to vigorous-intensity aerobic activity 150 minutes per week if tolerated. Lipid-lowering medications: see documented in medical record.   Generalized  obesity  BMI 28.0-28.9,adult  Will recheck labs in 1-3 months     Goals Increase protein intake Increase water intake Start resistance training.    Return in about 2 weeks (around 08/28/2024).Margaret Rich She was informed of the importance of frequent follow up visits to maximize her success with intensive lifestyle modifications for her multiple health conditions.   ATTESTASTION STATEMENTS:  Reviewed by clinician on day of visit: allergies, medications, problem list, medical history, surgical history, family history, social history, and previous encounter notes.   I personally spent a total of 30 minutes in the care of the patient today including preparing to see the patient, getting/reviewing separately obtained history, performing a medically appropriate exam/evaluation, counseling and educating, and independently interpreting results.    Margaret Rich. Margaret Rich

## 2024-08-21 ENCOUNTER — Encounter: Admitting: Physical Therapy

## 2024-08-28 ENCOUNTER — Ambulatory Visit: Attending: Obstetrics | Admitting: Physical Therapy

## 2024-08-28 ENCOUNTER — Ambulatory Visit: Admitting: Nurse Practitioner

## 2024-08-28 ENCOUNTER — Encounter: Payer: Self-pay | Admitting: Nurse Practitioner

## 2024-08-28 VITALS — BP 132/86 | HR 72 | Temp 98.4°F | Ht 66.0 in | Wt 177.0 lb

## 2024-08-28 DIAGNOSIS — R632 Polyphagia: Secondary | ICD-10-CM

## 2024-08-28 DIAGNOSIS — R293 Abnormal posture: Secondary | ICD-10-CM | POA: Diagnosis present

## 2024-08-28 DIAGNOSIS — M6281 Muscle weakness (generalized): Secondary | ICD-10-CM | POA: Insufficient documentation

## 2024-08-28 DIAGNOSIS — Z6828 Body mass index (BMI) 28.0-28.9, adult: Secondary | ICD-10-CM | POA: Diagnosis not present

## 2024-08-28 DIAGNOSIS — E669 Obesity, unspecified: Secondary | ICD-10-CM | POA: Diagnosis not present

## 2024-08-28 DIAGNOSIS — R279 Unspecified lack of coordination: Secondary | ICD-10-CM | POA: Diagnosis present

## 2024-08-28 NOTE — Therapy (Signed)
 OUTPATIENT PHYSICAL THERAPY FEMALE PELVIC TREATMENT   Patient Name: Margaret Rich MRN: [id number] DOB:[birth year], 43 y.o., female Today's Date: 08/28/2024  END OF SESSION:  PT End of Session - 08/28/24 0935     Visit Number 2    Number of Visits 8    Date for PT Re-Evaluation 08/05/24    Authorization Type [employer]    PT Start Time 0845    PT Stop Time 0930    PT Time Calculation (min) 45 min    Activity Tolerance Patient tolerated treatment well    Behavior During Therapy [facility] for tasks assessed/performed          Past Medical History:  Diagnosis Date   Migraine    Sleep apnea    Past Surgical History:  Procedure Laterality Date   CARPAL TUNNEL RELEASE  12/2023   INTRAUTERINE DEVICE INSERTION     Patient Active Problem List   Diagnosis Date Noted   SUI (stress urinary incontinence, female) 06/09/2024   Bladder pain syndrome 06/09/2024   Pelvic pain 06/09/2024   Pelvic organ prolapse quantification stage 1 cystocele 06/09/2024   Elevated lipids 03/10/2024   Mild obstructive sleep apnea 07/12/2023   Poor compliance with CPAP treatment 07/12/2023   Somatic dysfunction of spine, sacral 08/03/2022   Joint hyperextensibility of multiple sites 07/12/2022   Gastroparesis 07/12/2022   Cervical high risk human papillomavirus (HPV) DNA test positive 06/06/2022   Non-restorative sleep 04/27/2022   Snoring 04/27/2022   Sleep walking 04/27/2022   Excessive daytime sleepiness 04/11/2022   Chronic fatigue 04/11/2022   Attention deficit hyperactivity disorder 11/30/2021   Carpal tunnel syndrome, right 01/18/2021   Biceps tendinitis of right upper extremity 01/18/2021   Anal fissure 09/24/2018   Anal pain 09/24/2018   Rectal bleeding 09/24/2018   Frequent headaches 07/31/2018   GERD (gastroesophageal reflux disease) 07/31/2018   High blood pressure 07/31/2018   Migraines 07/31/2018   Urine incontinence 07/31/2018   Urinary tract infection 07/31/2018   PCP:    Jason Leita Repine, FNP   REFERRING PROVIDER: Guadlupe Lianne DASEN, MD   REFERRING DIAG: N39.3 (ICD-10-CM) - SUI (stress urinary incontinence, female) N30.10 (ICD-10-CM) - Bladder pain syndrome  THERAPY DIAG:  Muscle weakness (generalized)  Unspecified lack of coordination  Abnormal posture  Rationale for Evaluation and Treatment: Rehabilitation  ONSET DATE: urinary urgency 10 years ago  SUBJECTIVE:                                                                                                                                                                                           SUBJECTIVE STATEMENT: Patient reports that  she is doing well today. Since she started taking Myrbetriq , the urge to constantly have to pee has gone away. Now, she feels cramping all the time and this gets worse with activity. She is unsure if this is endometriosis. No urinary incontinence. No bowel concerns to report.   Eval: Patient reports to PFPT with pelvic pain that is general in nature. She also deals with urinary urgency - she has tried medication for this and it didn't help. When she stands up, she feels an intense urge to void. She does not leak urine.  PAIN:  Are you having pain? Yes NPRS scale: 4-5/10 current, 6-7/10 at worst  Pain location: Deep, Bilateral, and Anterior  Pain type: aching Pain description: intermittent   Aggravating factors: sexual arousal (blood flow to this area), being active in general  Relieving factors: NSAIDs, heat  PRECAUTIONS: None  RED FLAGS: None   WEIGHT BEARING RESTRICTIONS: No  FALLS:  Has patient fallen in last 6 months? 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, taking care of 2 kids, has a lot of hypermobility so swimming seems to feel very good   PLOF: Independent  PATIENT GOALS: to decrease pain, decrease the high sense of urgency   PERTINENT HISTORY:  Sexual abuse: No  BOWEL  MOVEMENT: Pain with bowel movement: No Type of bowel movement:Type (Bristol Stool Scale) 4-5, Frequency 1-2x/day , Strain no, and Splinting no Fully empty rectum: No Leakage: No Pads: No Fiber supplement/laxative Yes - psyllium husk   URINATION: Pain with urination: No Fully empty bladder: No Stream: depends  Urgency: Yes  Frequency: every 2 hours or so, gets up 1x to void at night  Leakage: none Pads: No  INTERCOURSE:  Ability to have vaginal penetration Yes  Pain with intercourse: none DrynessNo Climax: yes Marinoff Scale: 0/3  PREGNANCY: Vaginal deliveries 2 Tearing Yes: tearing with first (bilaterally at the urethra), minor tearing with second that healed naturally  Episiotomy No C-section deliveries 0 Currently pregnant No  PROLAPSE: None  OBJECTIVE:  Note: Objective measures were completed at Evaluation unless otherwise noted.  PATIENT SURVEYS:  PFIQ-7: 25  COGNITION: Overall cognitive status: Within functional limits for tasks assessed     SENSATION: Light touch: Appears intact  LUMBAR SPECIAL TESTS:  Single leg stance test: Positive  FUNCTIONAL TESTS:  Squat: general lumbopelvic hypermobility with squat test, weight shift laterally to the right   GAIT: Assistive device utilized: None Comments: mild trendelenburg gait pattern with ambulation   POSTURE: rounded shoulders and forward head  LUMBARAROM/PROM: within normal limits for all motions bilaterally (hypermobility present)  LOWER EXTREMITY ROM: within normal limits for all motions bilaterally (hypermobility present)  LOWER EXTREMITY MMT: 4/5 bilateral knees and hips grossly   PALPATION:   General: no significant tenderness to palpation of bilateral adductors or hip flexors   Pelvic Alignment: within normal limits   Abdominal: upper chest breathing, abdominal bracing, and decreased lower rib excursion present at rest                External Perineal Exam: sufficient moisture levels  present with sufficient clitoral hood mobility                              Internal Pelvic Floor: Patient fully consents to today's internal examination. She demonstrates a strong contraction with stiffness throughout the musculature that is limiting  full range of motion of the pelvic floor. Combined with an exhale, her contraction strength was improved and more coordinated with her breath. No significant tension in either side or layer of pelvic floor musculature. 0/10 pain upon end of examination.  Patient confirms identification and approves PT to assess internal pelvic floor and treatment Yes No emotional/communication barriers or cognitive limitation. Patient is motivated to learn. Patient understands and agrees with treatment goals and plan. PT explains patient will be examined in standing, sitting, and lying down to see how their muscles and joints work. When they are ready, they will be asked to remove their underwear so PT can examine their perineum. The patient is also given the option of providing their own chaperone as one is not provided in our facility. The patient also has the right and is explained the right to defer or refuse any part of the evaluation or treatment including the internal exam. With the patient's consent, PT will use one gloved finger to gently assess the muscles of the pelvic floor, seeing how well it contracts and relaxes and if there is muscle symmetry. After, the patient will get dressed and PT and patient will discuss exam findings and plan of care. PT and patient discuss plan of care, schedule, attendance policy and HEP activities.  PELVIC MMT:    MMT eval  Vaginal 4/5, 10 quick flicks, 10 second hold  Internal Anal Sphincter   External Anal Sphincter   Puborectalis   Diastasis Recti   (Blank rows = not tested)       TONE: Within normal limits bilaterally in superficial and deep pelvic floor musculature   PROLAPSE: None present in hooklying   TODAY'S  TREATMENT:                                                                                                                              DATE:   EVAL 07/08/24: Examination completed, findings reviewed, pt educated on POC, HEP, and self care. Pt motivated to participate in PT and agreeable to attempt recommendations.   Neuro re-ed: Hooklying diaphragmatic breathing + pelvic floor lengthening with inhalation and shortening with exhalation 2x10  Supine Butterfly Groin Stretch  - 1 x daily - 7 x weekly - 2 sets - hold - Supine Single Knee to Chest Stretch  - 1 x daily - 7 x weekly - 2 sets - hold - Supine Lower Trunk Rotation  - 1 x daily - 7 x weekly - 2 sets - 10 reps - Diaphragmatic Breathing in Child's Pose with Pelvic Floor Relaxation  - 1 x daily - 7 x weekly - 2 sets - hold Self care: Relative anatomy and the connection between the diaphragm and pelvic floor, water intake vs bladder irritants  08/28/24: Seated pelvic floor contraction + diaphragmatic breathing 2x10  Seated ball press + transverse abdominis contraction on exhale 2x10  Sidelying clamshell + reverse clamshell + diaphragmatic breathing 2x10  Sit to stand + adductor ball squeeze + diaphragmatic breathing 2x10 Bridge + adductor ball squeeze + diaphragmatic breathing 2x10  Urge drill for urge urinary incontinence management   PATIENT EDUCATION:  Education details: Relative anatomy and the connection between the diaphragm and pelvic floor, water intake vs bladder irritants Person educated: Patient Education method: Explanation, Demonstration, Tactile cues, Verbal cues, and Handouts Education comprehension: verbalized understanding, returned demonstration, verbal cues required, tactile cues required, and needs further education  HOME EXERCISE PROGRAM: Access Code: [email or web address] URL: [email or web address] Date: 08/28/2024 Prepared by: Celena Domino  Exercises - Seated Pelvic Floor Contraction  - 1 x daily  - 7 x weekly - 2 sets - 10 reps - Sit to Stand with Mercer Between Knees  - 1 x daily - 7 x weekly - 2 sets - 10 reps - Supine Bridge with Mini Swiss Ball Between Knees  - 1 x daily - 7 x weekly - 2 sets - 10 reps - Clamshell  - 1 x daily - 7 x weekly - 2 sets - 10 reps - Seated Abdominal Press into [employer]  - 1 x daily - 7 x weekly - 2 sets - 10 reps - Sidelying Reverse Clamshell  - 1 x daily - 7 x weekly - 2 sets - 10 reps - Supine Butterfly Groin Stretch  - 1 x daily - 7 x weekly - 2 sets - hold - Supine Lower Trunk Rotation  - 1 x daily - 7 x weekly - 2 sets - 10 reps - Diaphragmatic Breathing in Child's Pose with Pelvic Floor Relaxation  - 1 x daily - 7 x weekly - 2 sets - hold  ASSESSMENT:  CLINICAL IMPRESSION: Patient is a 43 y.o. female  who was seen today for physical therapy evaluation and treatment for pelvic pain and urinary urgency. She has still been having the cramping pain in the pelvic floor, but no urinary urgency or incontinence. External pelvic floor strengthening introduced today and pt tolerated this very well with minimal cueing. Overall, patient tolerated session well and Pt would benefit from additional PT to further address deficits.    OBJECTIVE IMPAIRMENTS: decreased coordination, decreased endurance, decreased mobility, decreased ROM, decreased strength, and pain.   ACTIVITY LIMITATIONS: carrying, lifting, bending, sitting, standing, squatting, stairs, transfers, and continence  PARTICIPATION LIMITATIONS: community activity, occupation, and yard work  PERSONAL FACTORS: Past/current experiences and Time since onset of injury/illness/exacerbation are also affecting patient's functional outcome.   REHAB POTENTIAL: Good  CLINICAL DECISION MAKING: Stable/uncomplicated  EVALUATION COMPLEXITY: Low   GOALS: Goals reviewed with patient? Yes  SHORT TERM GOALS: Target date: 08/05/2024  Pt will be independent with HEP.  Baseline: Goal status:  INITIAL  2.  Pt will be independent with diaphragmatic breathing and down training activities in order to improve pelvic floor relaxation. Baseline:  Goal status: INITIAL  3.  Pt will be able to correctly perform diaphragmatic breathing and appropriate pressure management in order to prevent worsening vaginal wall laxity and improve pelvic floor A/ROM.  Baseline:  Goal status: INITIAL  4.  Pt will be able to teach back and utilize urge suppression technique in order to help reduce number of trips to the bathroom.   Baseline:  Goal status: INITIAL  5.  Pt will report 50% reduction of pain due to improvements in posture, strength, and muscle length in pelvic floor musculature to improve quality of life and decrease functional limitations. Baseline:  Goal status: INITIAL  LONG TERM GOALS: Target  date: 01/08/2025  Pt will be independent with advanced HEP.  Baseline:  Goal status: INITIAL  2.  Pt to demonstrate improved coordination of pelvic floor and breathing mechanics with 10# squat with appropriate synergistic patterns to decrease pain and leakage at least 75% of the time for improved ability to complete a 30 minute workout with strain at pelvic floor and symptoms.   Baseline:  Goal status: INITIAL  3.  Pt will be independent with the knack, urge suppression technique, and double voiding in order to improve bladder habits and decrease urinary incontinence.   Baseline:  Goal status: INITIAL  4.  Pt will have 75% less urgency due to bladder retraining and strengthening to decrease trips to bathroom during daytime. Baseline:  Goal status: INITIAL  5.  Pt to demonstrate at least 4+/5 bil hip strength for improved pelvic stability and functional squats without urgency/pain/leakage.  Baseline:  Goal status: INITIAL  PLAN:  PT FREQUENCY: 1-2x/week  PT DURATION: 10 weeks  PLANNED INTERVENTIONS: 97110-Therapeutic exercises, 97530- Therapeutic activity, 97112- Neuromuscular  re-education, 97535- Self Care, [id number]- Manual therapy, Patient/Family education, Taping, Joint mobilization, Spinal mobilization, Scar mobilization, Cryotherapy, and Moist heat  PLAN FOR NEXT SESSION: continued pelvic floor AROM in seated, downtraining stretches, introduce hip and core strengthening, abdominal massage  Celena JAYSON Domino, PT 08/28/2024, 9:35 AM

## 2024-08-28 NOTE — Progress Notes (Signed)
 Office: [phone]  /  Fax: [phone]  WEIGHT SUMMARY AND BIOMETRICS  Weight Lost Since Last Visit: 0  Weight Gained Since Last Visit: 1lb   Vitals Temp: 98.4 F (36.9 C) BP: 132/86 Pulse Rate: 72 SpO2: 99 %   Anthropometric Measurements Height: 5' 6 (1.676 m) Weight: 177 lb (80.3 kg) BMI (Calculated): 28.58 Weight at Last Visit: 176lb Weight Lost Since Last Visit: 0 Weight Gained Since Last Visit: 1lb Starting Weight: 191lb Total Weight Loss (lbs): 16 lb (7.258 kg)   Body Composition  Body Fat %: 35.3 % Fat Mass (lbs): 62.8 lbs Muscle Mass (lbs): 109.2 lbs Total Body Water (lbs): 79.4 lbs Visceral Fat Rating : 7   Other Clinical Data Fasting: no Labs: no Today's Visit #: 9 Starting Date: 03/06/24     HPI  Chief Complaint: OBESITY  Margaret Rich is here to discuss her progress with her obesity treatment plan. She is on the keeping a food journal and adhering to recommended goals of 1500-1600 calories and 45 protein and states she is following her eating plan approximately 65 % of the time. She states she is exercising 0 minutes 0 days per week.   Interval History:  Since last office visit she has gained 1 pound.  She notes increased hunger and feels it's due to the weather changes. She is no longer following IF.  She has been tracking consistently over the past 2 weeks.  She is averaging around 1800-2000 calories, 81 grams of protein and 200 grams of carbs. She is drinking decaf coffee and water.    Pharmacotherapy for weight loss: She is not currently taking medications  for medical weight loss.     Previous pharmacotherapy for medical weight loss:  None   Bariatric surgery:  Patient has not had bariatric surgery.   PHYSICAL EXAM:  Blood pressure 132/86, pulse 72, temperature 98.4 F (36.9 C), height 5' 6 (1.676 m), weight 177 lb (80.3 kg), SpO2 99%. Body mass index is 28.57 kg/m.  General: She is overweight, cooperative, alert, well developed,  and in no acute distress. PSYCH: Has normal mood, affect and thought process.   Extremities: No edema.  Neurologic: No gross sensory or motor deficits. No tremors or fasciculations noted.    DIAGNOSTIC DATA REVIEWED:  BMET    Component Value Date/Time   NA 139 06/19/2024 1235   NA 138 03/06/2024 0844   K 4.1 06/19/2024 1235   CL 105 06/19/2024 1235   CO2 27 06/19/2024 1235   GLUCOSE 83 06/19/2024 1235   BUN 14 06/19/2024 1235   BUN 8 03/06/2024 0844   CREATININE 0.84 06/19/2024 1235   CALCIUM 9.3 06/19/2024 1235   Lab Results  Component Value Date   HGBA1C 5.6 03/06/2024   HGBA1C 5.5 05/11/2020   Lab Results  Component Value Date   INSULIN  21.0 03/06/2024   Lab Results  Component Value Date   TSH 2.68 08/24/2023   CBC    Component Value Date/Time   WBC 8.7 06/19/2024 1235   RBC 4.80 06/19/2024 1235   HGB 13.4 06/19/2024 1235   HCT 40.2 06/19/2024 1235   PLT 220.0 06/19/2024 1235   MCV 83.8 06/19/2024 1235   MCHC 33.4 06/19/2024 1235   RDW 15.5 06/19/2024 1235   Iron Studies No results found for: IRON, TIBC, FERRITIN, IRONPCTSAT Lipid Panel     Component Value Date/Time   CHOL 219 (H) 03/06/2024 0844   TRIG 150 (H) 03/06/2024 0844   HDL 55 03/06/2024 0844  LDLCALC 137 (H) 03/06/2024 0844   Hepatic Function Panel     Component Value Date/Time   PROT 7.3 06/19/2024 1235   PROT 7.3 03/06/2024 0844   ALBUMIN 4.8 06/19/2024 1235   ALBUMIN 4.7 03/06/2024 0844   AST 15 06/19/2024 1235   ALT 14 06/19/2024 1235   ALKPHOS 64 06/19/2024 1235   BILITOT 0.7 06/19/2024 1235   BILITOT 0.4 03/06/2024 0844      Component Value Date/Time   TSH 2.68 08/24/2023 1118   Nutritional Lab Results  Component Value Date   VD25OH 37.3 03/06/2024   VD25OH 29.94 (L) 05/11/2020     ASSESSMENT AND PLAN  TREATMENT PLAN FOR OBESITY:  Recommended Dietary Goals  Margaret Rich is currently in the action stage of change. As such, her goal is to continue weight  management plan. She has agreed to keeping a food journal and adhering to recommended goals of 1500-1600 calories and 90+ grams protein.  Behavioral Intervention  We discussed the following Behavioral Modification Strategies today: increasing lean protein intake to established goals, decreasing simple carbohydrates , increasing vegetables, increasing fiber rich foods, increasing water intake , work on meal planning and preparation, work on tracking and journaling calories using tracking application, reading food labels , keeping healthy foods at home, continue to practice mindfulness when eating, planning for success, continue to work on maintaining a reduced calorie state, getting the recommended amount of protein, incorporating whole foods, making healthy choices, staying well hydrated and practicing mindfulness when eating., and increase protein intake, fibrous foods (25 grams per day for women, 30 grams for men) and water to improve satiety and decrease hunger signals. .  Additional resources provided today: NA  Recommended Physical Activity Goals  Margaret Rich has been advised to work up to 150 minutes of moderate intensity aerobic activity a week and strengthening exercises 2-3 times per week for cardiovascular health, weight loss maintenance and preservation of muscle mass.   She has agreed to Think about enjoyable ways to increase daily physical activity and overcoming barriers to exercise, Increase physical activity in their day and reduce sedentary time (increase NEAT)., Work on scheduling and tracking physical activity. , and Combine aerobic and strengthening exercises for efficiency and improved cardiometabolic health.   Pharmacotherapy She is not interested in starting a weight loss medication  ASSOCIATED CONDITIONS ADDRESSED TODAY  Action/Plan  Polyphagia Intensive lifestyle modifications are the first line treatment for this issue. We discussed several lifestyle modifications today  and she will continue to work on diet, exercise and weight loss efforts. Orders and follow up as documented in patient record.  Needs to increase protein intake and decrease carb intake  Counseling Polyphagia is excessive hunger. Causes can include: low blood sugars, hypERthyroidism, PMS, lack of sleep, stress, insulin  resistance, diabetes, certain medications, and diets that are deficient in protein and fiber.    Generalized obesity  BMI 28.0-28.9,adult      Again discussed the importance of cardio and resistance training.    Return in about 4 weeks (around 09/25/2024).Margaret Rich She was informed of the importance of frequent follow up visits to maximize her success with intensive lifestyle modifications for her multiple health conditions.   ATTESTASTION STATEMENTS:  Reviewed by clinician on day of visit: allergies, medications, problem list, medical history, surgical history, family history, social history, and previous encounter notes.   I personally spent a total of 32 minutes in the care of the patient today including preparing to see the patient, getting/reviewing separately obtained history, performing a medically  appropriate exam/evaluation, counseling and educating, and documenting clinical information in the EHR.    Margaret Rich SAUNDERS. Jocelyn Lowery FNP-C

## 2024-08-28 NOTE — Patient Instructions (Signed)

## 2024-09-02 ENCOUNTER — Ambulatory Visit: Admitting: Obstetrics

## 2024-09-04 ENCOUNTER — Encounter: Admitting: Physical Therapy

## 2024-09-09 ENCOUNTER — Encounter: Admitting: Physical Therapy

## 2024-09-11 ENCOUNTER — Ambulatory Visit: Admitting: Physical Therapy

## 2024-09-19 ENCOUNTER — Ambulatory Visit: Admitting: Obstetrics

## 2024-09-19 VITALS — BP 107/73 | HR 84

## 2024-09-19 DIAGNOSIS — R102 Pelvic and perineal pain unspecified side: Secondary | ICD-10-CM | POA: Diagnosis not present

## 2024-09-19 DIAGNOSIS — N3010 Interstitial cystitis (chronic) without hematuria: Secondary | ICD-10-CM | POA: Diagnosis not present

## 2024-09-19 DIAGNOSIS — N3281 Overactive bladder: Secondary | ICD-10-CM

## 2024-09-19 DIAGNOSIS — N393 Stress incontinence (female) (male): Secondary | ICD-10-CM | POA: Diagnosis not present

## 2024-09-19 MED ORDER — MIRABEGRON ER 50 MG PO TB24: 50.0000 mg | ORAL_TABLET | Freq: Every day | ORAL | 2 refills | Status: DC

## 2024-09-19 NOTE — Assessment & Plan Note (Signed)
-   prior POCT catheterized sample negative, spotting - For treatment of stress urinary incontinence,  non-surgical options include expectant management, weight loss, physical therapy, as well as a pessary.  Surgical options include a midurethral sling, Burch urethropexy, and transurethral injection of a bulking agent. - encouraged to resume pelvic floor PT exercises - reports minimal bother at this time

## 2024-09-19 NOTE — Patient Instructions (Addendum)
 Increase mirabegron  to 50mg  daily.   Continue to monitor bladder symptoms and dietary trigger with cimetidine  as needed for flares.   You should receive a call from GYN regarding appointment to establish care.

## 2024-09-19 NOTE — Assessment & Plan Note (Signed)
-   We discussed the symptoms of overactive bladder (OAB), which include urinary urgency, urinary frequency, nocturia, with or without urge incontinence.  While we do not know the exact etiology of OAB, several treatment options exist. We discussed management including behavioral therapy (decreasing bladder irritants, urge suppression strategies, timed voids, bladder retraining), physical therapy, medication; for refractory cases posterior tibial nerve stimulation, sacral neuromodulation, and intravesical botulinum toxin injection.  For anticholinergic medications, we discussed the potential side effects of anticholinergics including dry eyes, dry mouth, constipation, cognitive impairment and urinary retention. For Beta-3 agonist medication, we discussed the potential side effect of elevated blood pressure which is more likely to occur in individuals with uncontrolled hypertension. - continue mirabegron , increase to 50mg  daily

## 2024-09-19 NOTE — Progress Notes (Signed)
 [facility] Return Visit  SUBJECTIVE  History of Present Illness: Margaret Rich is a 43 y.o. female seen in follow-up for stress urinary incontinence, bladder pain syndrome, pelvic pain, stage I pelvic organ prolapse, history of anal fissure . Plan at last visit was pelvic floor PT, continue Mirena IUD, trial of cimetidine , Voltaren  gel PRN, and fiber supplementation.   Reports 70% improvement Infrequent SUI similar to prior to pelvic floor Bladder discomfort reduced to 2-3/10 with reduce urinary urgency sensation Day time voids 6-8.  Nocturia: 1 times per night to void.  Tried bladder instillation x 1 with minimal relief, noted bladder spasm and started mirabegron  25mg  with reduced urgency Reports increased uterine cramping sensation, discussed possible endometriosis with  Avoiding EtOH and acidity with relief, uses cimetidine  PRN flares from dietary triggers Started psyllium husk 1 tablespoon 1-2x/day  Past Medical History: Patient  has a past medical history of Migraine and Sleep apnea.   Past Surgical History: She  has a past surgical history that includes Carpal tunnel release (12/2023) and Intrauterine device insertion.   Medications: She has a current medication list which includes the following prescription(s): vitamin c with rose hips, bupropion , cimetidine , diclofenac  sodium, magnesium gluconate, mirabegron  er, multivitamin-lutein, fish oil, ondansetron , OVER THE COUNTER MEDICATION, phenazopyridine , and sumatriptan .   Allergies: Patient is allergic to latex.   Social History: Patient  reports that she quit smoking about 16 years ago. Her smoking use included cigarettes. She started smoking about 21 years ago. She has never used smokeless tobacco. She reports current alcohol use. She reports that she does not use drugs.     OBJECTIVE     Physical Exam: Vitals:   09/19/24 0927  BP: 107/73  Pulse: 84   Gen: No apparent distress, A&O x 3.  Detailed  Urogynecologic Evaluation:  Deferred. Prior exam showed:      No data to display             ASSESSMENT AND PLAN    Margaret Rich is a 43 y.o. with:  1. Pelvic pain   2. Bladder pain syndrome   3. OAB (overactive bladder)   4. SUI (stress urinary incontinence, female)     Pelvic pain Assessment & Plan: - reports pubic symphysis discomfort with pregnancy, underwent pelvic floor for pain and incontinence with some relief - pain with palpation of R ilioinguinal nerve distribution - The origin of pelvic pain can be multifactorial, including primary, reactive to a different pain source, nerve irritation, trauma, or even part of a centralized pain syndrome.Treatment options include pelvic floor physical therapy, stretching topical or oral pain medication, trigger point injections or centrally acting pain medications.   - encouraged abdominal stretching and resume pelvic floor PT exercises - Rx Voltaren  gel with minimal use, encouraged to use as needed for pain - continue NSAIDs, heat for pain relief - encouraged sleeping with pillow between her legs and abdominal/pelvic binder for pain with ambulation  - desires to establish care with GYN to review workup and treatment of endometriosis  Orders: -     Ambulatory referral to Gynecology  Bladder pain syndrome Assessment & Plan: - urgency with palpation of bladder and pubic symphysis in prior exam with history of migraines and anal fissures  - triggered by caffeine, some relief with NSAIDs, heat, and amitriptyline. Discussed Prelief PRN dietary triggers - underwent pelvic floor PT sessions x 2 - For irritative bladder we reviewed treatment options including altering her diet to avoid irritative beverages and foods  as well as attempting to decrease stress and other exacerbating factors.  We also discussed using pyridium  and similar over-the-counter medications for pain relief as needed. We discussed the pentad of medications including Tums,  an antihistamine such as Vistaril, amitriptyline, and L-arginine.  We also discussed in-office bladder instillations for pain flares, as well as cystoscopy with hydrodistention in the operating room, which can be both diagnostic and therapeutic. She was also given information on the IC Network at https://www.ic-network.com for bladder diet suggestions and patient forums for support. - encouraged to resume pelvic floor relaxation exercises - continue cimetidine  at bedtime PRN dietary triggers and titrate up to 2 tabs/day as needed  - encouraged stress management and CAM therapies - encouraged to return if change in urinary symptoms to r/o UTI and consider bladder instillations - discussed overlapping symptomatology with OAB and rUTI - declined additional therapies at this time due to improvement from Mirabegron , increase to 50mg  daily   OAB (overactive bladder) Assessment & Plan: - We discussed the symptoms of overactive bladder (OAB), which include urinary urgency, urinary frequency, nocturia, with or without urge incontinence.  While we do not know the exact etiology of OAB, several treatment options exist. We discussed management including behavioral therapy (decreasing bladder irritants, urge suppression strategies, timed voids, bladder retraining), physical therapy, medication; for refractory cases posterior tibial nerve stimulation, sacral neuromodulation, and intravesical botulinum toxin injection.  For anticholinergic medications, we discussed the potential side effects of anticholinergics including dry eyes, dry mouth, constipation, cognitive impairment and urinary retention. For Beta-3 agonist medication, we discussed the potential side effect of elevated blood pressure which is more likely to occur in individuals with uncontrolled hypertension. - continue mirabegron , increase to 50mg  daily   SUI (stress urinary incontinence, female) Assessment & Plan: - prior POCT catheterized sample  negative, spotting - For treatment of stress urinary incontinence,  non-surgical options include expectant management, weight loss, physical therapy, as well as a pessary.  Surgical options include a midurethral sling, Burch urethropexy, and transurethral injection of a bulking agent. - encouraged to resume pelvic floor PT exercises - reports minimal bother at this time   Other orders -     Mirabegron  ER; Take 1 tablet (50 mg total) by mouth daily.  Dispense: 30 tablet; Refill: 2  Time spent: I spent 24 minutes dedicated to the care of this patient on the date of this encounter to include pre-visit review of records, face-to-face time with the patient discussing pelvic pain, bladder pain, OAB, SUI, and post visit documentation and ordering medication/ testing.   Lianne ONEIDA Gillis, MD

## 2024-09-19 NOTE — Assessment & Plan Note (Addendum)
-   reports pubic symphysis discomfort with pregnancy, underwent pelvic floor for pain and incontinence with some relief - pain with palpation of R ilioinguinal nerve distribution - The origin of pelvic pain can be multifactorial, including primary, reactive to a different pain source, nerve irritation, trauma, or even part of a centralized pain syndrome.Treatment options include pelvic floor physical therapy, stretching topical or oral pain medication, trigger point injections or centrally acting pain medications.   - encouraged abdominal stretching and resume pelvic floor PT exercises - Rx Voltaren  gel with minimal use, encouraged to use as needed for pain - continue NSAIDs, heat for pain relief - encouraged sleeping with pillow between her legs and abdominal/pelvic binder for pain with ambulation  - desires to establish care with GYN to review workup and treatment of endometriosis

## 2024-09-19 NOTE — Assessment & Plan Note (Signed)
-   urgency with palpation of bladder and pubic symphysis in prior exam with history of migraines and anal fissures  - triggered by caffeine, some relief with NSAIDs, heat, and amitriptyline. Discussed Prelief PRN dietary triggers - underwent pelvic floor PT sessions x 2 - For irritative bladder we reviewed treatment options including altering her diet to avoid irritative beverages and foods as well as attempting to decrease stress and other exacerbating factors.  We also discussed using pyridium  and similar over-the-counter medications for pain relief as needed. We discussed the pentad of medications including Tums, an antihistamine such as Vistaril, amitriptyline, and L-arginine.  We also discussed in-office bladder instillations for pain flares, as well as cystoscopy with hydrodistention in the operating room, which can be both diagnostic and therapeutic. She was also given information on the IC Network at https://www.ic-network.com for bladder diet suggestions and patient forums for support. - encouraged to resume pelvic floor relaxation exercises - continue cimetidine  at bedtime PRN dietary triggers and titrate up to 2 tabs/day as needed  - encouraged stress management and CAM therapies - encouraged to return if change in urinary symptoms to r/o UTI and consider bladder instillations - discussed overlapping symptomatology with OAB and rUTI - declined additional therapies at this time due to improvement from Mirabegron , increase to 50mg  daily

## 2024-09-25 ENCOUNTER — Ambulatory Visit: Admitting: Nurse Practitioner

## 2024-10-09 ENCOUNTER — Encounter: Payer: Self-pay | Admitting: Radiology

## 2024-10-10 ENCOUNTER — Ambulatory Visit: Admitting: Radiology

## 2024-10-10 ENCOUNTER — Encounter: Payer: Self-pay | Admitting: Radiology

## 2024-10-10 VITALS — BP 122/82 | HR 89 | Ht 66.5 in | Wt 181.0 lb

## 2024-10-10 DIAGNOSIS — R102 Pelvic and perineal pain unspecified side: Secondary | ICD-10-CM

## 2024-10-10 NOTE — Progress Notes (Signed)
 Margaret Rich May 26, 1981 [id number]   History:  43 y.o. G2P2 Referred from [facility] for pelvic pain. Complains of pelvic pain x's 10 + years (increased over the last few years). Family hx endometriosis--(patient's mom). Pain is worse with physical activity, intercourse and when she still had periods. Before IUD she tried continuous OCPs which did not help. Period pain is controlled with IUD. No trouble conceiving. On Mybetriq for OAB/bladder spasm which has helped. Hx of anal fissures, none currently.   Gynecologic History No LMP recorded. (Menstrual status: IUD).   Contraception/Family planning: IUD and vasectomy Sexually active: yes Last Pap: 2023. Results were: normal Last mammogram: 9/24. Results were: normal  Obstetric History OB History  Gravida Para Term Preterm AB Living  2 2 1   2   SAB IAB Ectopic Multiple Live Births      2    # Outcome Date GA Lbr Len/2nd Weight Sex Type Anes PTL Lv  2 Para           1 Term     M Vag-Spont   LIV     Current Outpatient Medications on File Prior to Visit  Medication Sig Dispense Refill   Ascorbic Acid (VITAMIN C WITH ROSE HIPS) 500 MG tablet Take 500 mg by mouth daily.     buPROPion  (WELLBUTRIN  XL) 150 MG 24 hr tablet Take 1 tablet by mouth once daily 90 tablet 0   cimetidine  (TAGAMET ) 200 MG tablet Take 1 tablet (200 mg total) by mouth at bedtime. 30 tablet 2   magnesium gluconate (MAGONATE) 500 MG tablet Take 500 mg by mouth 2 (two) times daily.     mirabegron  ER (MYRBETRIQ ) 50 MG TB24 tablet Take 1 tablet (50 mg total) by mouth daily. 30 tablet 2   multivitamin-lutein (OCUVITE-LUTEIN) CAPS capsule Take 1 capsule by mouth daily.     Omega-3 Fatty Acids (FISH OIL) 300 MG CAPS Take by mouth.     ondansetron  (ZOFRAN ) 4 MG tablet Take 1 tablet (4 mg total) by mouth every 8 (eight) hours as needed for nausea or vomiting. 20 tablet 0   SUMAtriptan  (IMITREX ) 25 MG tablet Take 1 tablet (25 mg total) by mouth every 2 (two) hours as  needed for migraine. TAKE 1 TABLET BY MOUTH EVERY 2 HOURS FOR MIGRAINE. MAY REPEAT IN 2 HOURS IF HEADACHE PERSISTS OR RECURS. 10 tablet 2   diclofenac  Sodium (VOLTAREN ) 1 % GEL Apply 2 g topically 4 (four) times daily. (Patient not taking: Reported on 10/10/2024) 150 g 0   OVER THE COUNTER MEDICATION Take 1 tablet by mouth 2 (two) times daily. Migrelief + M (Patient not taking: Reported on 10/10/2024)     phenazopyridine  (PYRIDIUM ) 200 MG tablet Take 1 tablet (200 mg total) by mouth 3 (three) times daily as needed for pain. (Patient not taking: Reported on 10/10/2024) 10 tablet 0   No current facility-administered medications on file prior to visit.    The following portions of the patient's history were reviewed and updated as appropriate: allergies, current medications, past family history, past medical history, past social history, past surgical history, and problem list.  Review of Systems  All other systems reviewed and are negative.   Past medical history, past surgical history, family history and social history were all reviewed and documented in the EPIC chart.  Exam:  Vitals:   10/10/24 0939  BP: 122/82  Pulse: 89  SpO2: 99%  Weight: 181 lb (82.1 kg)  Height: 5' 6.5 (1.689 m)  Body mass index is 28.78 kg/m.  Physical Exam Vitals and nursing note reviewed. Exam conducted with a chaperone present.  Constitutional:      Appearance: Normal appearance. She is well-developed.  Pulmonary:     Effort: Pulmonary effort is normal.  Abdominal:     General: Abdomen is flat.     Palpations: Abdomen is soft.  Genitourinary:    General: Normal vulva.     Vagina: No vaginal discharge, erythema, bleeding or lesions.     Cervix: Normal. No discharge, friability, lesion or erythema.     Uterus: Normal.      Adnexa:        Right: Tenderness present.        Left: Tenderness present.   Neurological:     Mental Status: She is alert.  Psychiatric:        Mood and Affect: Mood  normal.        Thought Content: Thought content normal.        Judgment: Judgment normal.      Darice Hoit, CMA present for exam  Assessment/Plan:   1. Pelvic pain (Primary) - Urinalysis,Complete w/RFL Culture - Pregnancy, urine; negative - US  PELVIS TRANSVAGINAL NON-OB (TV ONLY); Future    Return for u/s only. Will contact with results and plan.   GINETTE COZIER B WHNP-BC 10:07 AM 10/10/2024

## 2024-10-12 LAB — URINALYSIS, COMPLETE W/RFL CULTURE
Bilirubin Urine: NEGATIVE
Casts: NONE SEEN /LPF
Glucose, UA: NEGATIVE
Hgb urine dipstick: NEGATIVE
Ketones, ur: NEGATIVE
Leukocyte Esterase: NEGATIVE
Nitrites, Initial: NEGATIVE
RBC / HPF: NONE SEEN /HPF (ref 0–2)
Specific Gravity, Urine: 1.015 (ref 1.001–1.035)
WBC, UA: NONE SEEN /HPF (ref 0–5)
Yeast: NONE SEEN /HPF
pH: 8.5 — ABNORMAL HIGH (ref 5.0–8.0)

## 2024-10-12 LAB — URINE CULTURE
MICRO NUMBER:: 17144267
Result:: NO GROWTH
SPECIMEN QUALITY:: ADEQUATE

## 2024-10-12 LAB — CULTURE INDICATED

## 2024-10-12 LAB — PREGNANCY, URINE: Preg Test, Ur: NEGATIVE

## 2024-10-14 ENCOUNTER — Other Ambulatory Visit

## 2024-10-14 ENCOUNTER — Ambulatory Visit: Payer: Self-pay | Admitting: Radiology

## 2024-10-16 ENCOUNTER — Ambulatory Visit (INDEPENDENT_AMBULATORY_CARE_PROVIDER_SITE_OTHER)

## 2024-10-16 DIAGNOSIS — R102 Pelvic and perineal pain unspecified side: Secondary | ICD-10-CM

## 2024-10-16 NOTE — Progress Notes (Signed)
 Normal u/s. I recommend consult with EB to discuss surgical options for diagnosis and treatment.

## 2024-10-20 ENCOUNTER — Encounter: Payer: Self-pay | Admitting: Radiology

## 2024-10-22 ENCOUNTER — Ambulatory Visit: Admitting: Obstetrics and Gynecology

## 2024-10-22 ENCOUNTER — Encounter: Payer: Self-pay | Admitting: Obstetrics and Gynecology

## 2024-10-22 VITALS — BP 108/68 | HR 78 | Ht 66.34 in | Wt 181.2 lb

## 2024-10-22 DIAGNOSIS — N938 Other specified abnormal uterine and vaginal bleeding: Secondary | ICD-10-CM | POA: Diagnosis not present

## 2024-10-22 DIAGNOSIS — Z1211 Encounter for screening for malignant neoplasm of colon: Secondary | ICD-10-CM

## 2024-10-22 DIAGNOSIS — N8110 Cystocele, unspecified: Secondary | ICD-10-CM | POA: Diagnosis not present

## 2024-10-22 DIAGNOSIS — N946 Dysmenorrhea, unspecified: Secondary | ICD-10-CM | POA: Diagnosis not present

## 2024-10-22 DIAGNOSIS — Z1231 Encounter for screening mammogram for malignant neoplasm of breast: Secondary | ICD-10-CM

## 2024-10-22 NOTE — Patient Instructions (Signed)
 Don't forget to do screening colonoscopy, now that you are 43 And mammogram annually It was nice meeting you today

## 2024-10-22 NOTE — Progress Notes (Signed)
 Laurajean D Hanks Aug 28, 1981 [id number]  Patient presents from [facility] for possible surgical consult and to review options Per Her last visit: History:  43 y.o. G2P2 Referred from [facility] for pelvic pain. Complains of pelvic pain x's 10 + years (increased over the last few years). Family hx endometriosis--(patient's mom). Pain is worse with physical activity, intercourse and when she still had periods. Before IUD she tried continuous OCPs which did not help. Period pain is controlled with IUD. No trouble conceiving. On Mybetriq for OAB/bladder spasm which has helped. Hx of anal fissures, none currently.    Today, patient states she has had pelvic cramping as long as she can remember and painful periods. Pain has become worse over the years. IUD has helped the bleeding, but has spotting and pain with that.  She does not desire more children. She is worried about prolapse and can feel a bulge now. Is on myrbetriq  and this has helped the bladder spasms. She is worried a hysterectomy could make the prolapse worse. Dyspareunia noted more the day after intercourse  Gynecologic History No LMP recorded. (Menstrual status: IUD).   Contraception/Family planning: IUD and vasectomy Sexually active: yes Last Pap: 2023. Results were: normal Last mammogram: 9/24. Results were: normal  Obstetric History OB History  Gravida Para Term Preterm AB Living  2 2 2  0 0 2  SAB IAB Ectopic Multiple Live Births  0 0 0 0 2    # Outcome Date GA Lbr Len/2nd Weight Sex Type Anes PTL Lv  2 Term           1 Term     M Vag-Spont   LIV     Current Outpatient Medications on File Prior to Visit  Medication Sig Dispense Refill   Ascorbic Acid (VITAMIN C WITH ROSE HIPS) 500 MG tablet Take 500 mg by mouth daily.     buPROPion  (WELLBUTRIN  XL) 150 MG 24 hr tablet Take 1 tablet by mouth once daily 90 tablet 0   cimetidine  (TAGAMET ) 200 MG tablet Take 1 tablet (200 mg total) by mouth at bedtime. 30 tablet 2    magnesium gluconate (MAGONATE) 500 MG tablet Take 500 mg by mouth 2 (two) times daily.     mirabegron  ER (MYRBETRIQ ) 50 MG TB24 tablet Take 1 tablet (50 mg total) by mouth daily. 30 tablet 2   multivitamin-lutein (OCUVITE-LUTEIN) CAPS capsule Take 1 capsule by mouth daily.     Omega-3 Fatty Acids (FISH OIL) 300 MG CAPS Take by mouth.     ondansetron  (ZOFRAN ) 4 MG tablet Take 1 tablet (4 mg total) by mouth every 8 (eight) hours as needed for nausea or vomiting. 20 tablet 0   SUMAtriptan  (IMITREX ) 25 MG tablet Take 1 tablet (25 mg total) by mouth every 2 (two) hours as needed for migraine. TAKE 1 TABLET BY MOUTH EVERY 2 HOURS FOR MIGRAINE. MAY REPEAT IN 2 HOURS IF HEADACHE PERSISTS OR RECURS. 10 tablet 2   diclofenac  Sodium (VOLTAREN ) 1 % GEL Apply 2 g topically 4 (four) times daily. (Patient not taking: Reported on 10/22/2024) 150 g 0   OVER THE COUNTER MEDICATION Take 1 tablet by mouth 2 (two) times daily. Migrelief + M (Patient not taking: Reported on 10/10/2024)     phenazopyridine  (PYRIDIUM ) 200 MG tablet Take 1 tablet (200 mg total) by mouth 3 (three) times daily as needed for pain. (Patient not taking: Reported on 10/22/2024) 10 tablet 0   No current facility-administered medications on file prior to visit.  The following portions of the patient's history were reviewed and updated as appropriate: allergies, current medications, past family history, past medical history, past social history, past surgical history, and problem list.  Review of Systems  All other systems reviewed and are negative.   Past medical history, past surgical history, family history and social history were all reviewed and documented in the EPIC chart.  Exam:  Vitals:   10/22/24 0825  BP: 108/68  Pulse: 78  SpO2: 98%  Weight: 181 lb 3.2 oz (82.2 kg)  Height: 5' 6.34 (1.685 m)   Body mass index is 28.95 kg/m.  Physical Exam Vitals and nursing note reviewed. Exam conducted with a chaperone present.   Constitutional:      Appearance: Normal appearance. She is well-developed.  Pulmonary:     Effort: Pulmonary effort is normal.  Abdominal:     General: Abdomen is flat.     Palpations: Abdomen is soft.  Genitourinary:    General: Normal vulva.     Vagina: No vaginal discharge, erythema, bleeding or lesions.     Cervix: Normal. No discharge, friability, lesion or erythema.     Uterus: Normal.      Adnexa:        Right: Tenderness present.        Left: Tenderness present.   Neurological:     Mental Status: She is alert.  Psychiatric:        Mood and Affect: Mood normal.        Thought Content: Thought content normal.        Judgment: Judgment normal.      Darice Hoit, CMA present for exam Last exam with [facility] above noted  Assessment/Plan:   1. Pelvic pain (Primary) Likely endometriosis IUD in place Dyspareunia dysmenorrhea Prolapse   All options reviewed with patient with hormones, ablation, RLH and r/b/a/I of each were reviewed in detail. Discussed option of combined case, if indicated with Dr. Guadlupe and myself.  She will discus with Dr. Guadlupe and let us  know what her decisions are.  30 minutes spent on reviewing records, imaging,  and one on one patient time and counseling patient and documentation Dr. Glennon Almarie MARLA Glennon [facility] 8:39 AM 10/22/2024

## 2024-11-18 ENCOUNTER — Ambulatory Visit: Admitting: Obstetrics and Gynecology

## 2024-11-28 ENCOUNTER — Encounter: Payer: Self-pay | Admitting: Nurse Practitioner

## 2024-11-28 ENCOUNTER — Other Ambulatory Visit: Payer: Self-pay | Admitting: Family

## 2024-11-28 NOTE — Telephone Encounter (Unsigned)
 Copied from CRM #[id number]. Topic: Clinical - Medication Refill >> Nov 28, 2024 12:03 PM J[facility] G wrote: Medication: buPROPion  (WELLBUTRIN  XL) 150 MG 24 hr tablet. Pt scheduled new pt visit on March 20th with Ms. Almarie.  Has the patient contacted their pharmacy? Yes (Agent: If no, request that the patient contact the pharmacy for the refill. If patient does not wish to contact the pharmacy document the reason why and proceed with request.) (Agent: If yes, when and what did the pharmacy advise?)  This is the patient's preferred pharmacy:  [facility] [address], [place] - [address] [place] [address] Phone: [phone] Fax: [phone]  Is this the correct pharmacy for this prescription? Yes If no, delete pharmacy and type the correct one.   Has the prescription been filled recently? Yes  Is the patient out of the medication? Yes  Has the patient been seen for an appointment in the last year OR does the patient have an upcoming appointment? Yes  Can we respond through MyChart? No  Agent: Please be advised that Rx refills may take up to 3 business days. We ask that you follow-up with your pharmacy.

## 2024-12-01 ENCOUNTER — Other Ambulatory Visit: Payer: Self-pay | Admitting: Nurse Practitioner

## 2024-12-01 MED ORDER — BUPROPION HCL ER (XL) 150 MG PO TB24: 150.0000 mg | ORAL_TABLET | Freq: Every day | ORAL | 0 refills | Status: AC

## 2024-12-19 ENCOUNTER — Other Ambulatory Visit: Payer: Self-pay | Admitting: Obstetrics

## 2024-12-19 NOTE — Telephone Encounter (Signed)
 Pt has apt 12/26/24

## 2024-12-26 ENCOUNTER — Encounter: Payer: Self-pay | Admitting: Obstetrics

## 2024-12-26 ENCOUNTER — Ambulatory Visit: Admitting: Obstetrics

## 2024-12-26 VITALS — BP 128/86 | HR 76

## 2024-12-26 DIAGNOSIS — N3010 Interstitial cystitis (chronic) without hematuria: Secondary | ICD-10-CM

## 2024-12-26 DIAGNOSIS — R102 Pelvic and perineal pain unspecified side: Secondary | ICD-10-CM | POA: Diagnosis not present

## 2024-12-26 DIAGNOSIS — N3281 Overactive bladder: Secondary | ICD-10-CM | POA: Diagnosis not present

## 2024-12-26 MED ORDER — SODIUM BICARBONATE 8.4 % IV SOLN
5.0000 mL | Freq: Once | INTRAVENOUS | Status: AC
  Administered 2024-12-26: 5 mL

## 2024-12-26 MED ORDER — BUPIVACAINE HCL 0.25 % IJ SOLN
20.0000 mL | Freq: Once | INTRAMUSCULAR | Status: AC
  Administered 2024-12-26: 20 mL

## 2024-12-26 MED ORDER — LIDOCAINE HCL 2 % IJ SOLN
20.0000 mL | Freq: Once | INTRAMUSCULAR | Status: AC
  Administered 2024-12-26: 400 mg

## 2024-12-26 MED ORDER — TROSPIUM CHLORIDE ER 60 MG PO CP24: 1.0000 | ORAL_CAPSULE | Freq: Every day | ORAL | 2 refills | Status: AC

## 2024-12-26 MED ORDER — CIMETIDINE 200 MG PO TABS: 200.0000 mg | ORAL_TABLET | Freq: Every day | ORAL | 2 refills | Status: AC

## 2024-12-26 MED ORDER — HEPARIN SODIUM (PORCINE) 10000 UNIT/ML IJ SOLN
10000.0000 [IU] | Freq: Once | INTRAMUSCULAR | Status: AC
  Administered 2024-12-26: 10000 [IU] via INTRAVESICAL

## 2024-12-26 NOTE — Patient Instructions (Addendum)
 Switch to Trospium  once daily.   For anticholinergic medications, we discussed the potential side effects of anticholinergics including dry eyes, dry mouth, constipation, rare risks of cognitive impairment and urinary retention. You were given Trospium  for 60mg  daily  It can take a month to start working so give it time, but if you have bothersome side effects call sooner and we can try a different medication.  Call us  if you have trouble filling the prescription or if it's not covered by your insurance.  Resume Cimetidine  200mg  as needed for bladder discomfort.   We will refer you to [facility].   Please return if you experience relief from bladder instillation today.

## 2024-12-26 NOTE — Progress Notes (Signed)
 Bladder Instillation: The patient was identified and verbally consented for the procedure.  The urethra was prepped with Betadine x 3. A 16 Fr foley catheter was inserted the bladder and drained for 200cc. The foley was then attached to a 50 mL syringe with the plunger removed.  The medication was slowly poured into the bladder via the syringe and foley.  The medication consisted of: 20ml of Lidocaine  2%, 20mL of Bupivicaine 0.25%, 10,000 units/mL Heparin , 5mL Sodium Bicarbonate  8.4%.  The foley was removed and the patient was asked to hold the liquids in her bladder for 30-60 minutes if possible.   Precautions were given and patient was instructed to call the office or on-call number for any concerns.  Margaret Rich, CMA

## 2024-12-26 NOTE — Assessment & Plan Note (Signed)
-   urgency with palpation of bladder and pubic symphysis on repeat exam with history of migraines and anal fissures  - triggered by caffeine, some relief with NSAIDs, heat, and amitriptyline. Discussed Prelief PRN dietary triggers - underwent [facility] sessions x 2 cost prohibitive, referral to [facility] - For irritative bladder we reviewed treatment options including altering her diet to avoid irritative beverages and foods as well as attempting to decrease stress and other exacerbating factors.  We also discussed using pyridium  and similar over-the-counter medications for pain relief as needed. We discussed the pentad of medications including Tums, an antihistamine such as Vistaril, amitriptyline, and L-arginine.  We also discussed in-office bladder instillations for pain flares, as well as cystoscopy with hydrodistention in the operating room, which can be both diagnostic and therapeutic.  - Rx to resume cimetidine  at bedtime PRN dietary triggers and titrate up to 2 tabs/day as needed  - encouraged stress management and CAM therapies - encouraged to return if change in urinary symptoms to r/o UTI  - provided bladder instillations today, concerns with cost due to copay - discussed overlapping symptomatology with OAB and rUTI - prior improvement from Mirabegron  50mg  daily, reassess symptoms after transition to Trospium

## 2024-12-26 NOTE — Assessment & Plan Note (Signed)
-   We discussed the symptoms of overactive bladder (OAB), which include urinary urgency, urinary frequency, nocturia, with or without urge incontinence.  While we do not know the exact etiology of OAB, several treatment options exist. We discussed management including behavioral therapy (decreasing bladder irritants, urge suppression strategies, timed voids, bladder retraining), physical therapy, medication; for refractory cases posterior tibial nerve stimulation, sacral neuromodulation, and intravesical botulinum toxin injection.  For anticholinergic medications, we discussed the potential side effects of anticholinergics including dry eyes, dry mouth, constipation, cognitive impairment and urinary retention. For Beta-3 agonist medication, we discussed the potential side effect of elevated blood pressure which is more likely to occur in individuals with uncontrolled hypertension. - mirabegron   50mg  with relief, cost prohibitive - Rx changed to Trospium  60mg  daily - [facility] cost prohibitive, referral to [facility]

## 2024-12-26 NOTE — Progress Notes (Signed)
 [facility] Return Visit  SUBJECTIVE  History of Present Illness: Margaret Rich is a 44 y.o. female seen in follow-up for stress urinary incontinence, bladder pain syndrome, pelvic pain, stage I pelvic organ prolapse, history of anal fissure . Plan at last visit was pelvic floor PT, continue Mirena IUD, PRN cimetidine , Voltaren  gel PRN, and fiber supplementation.   Continues to reports 60-70% improvement Continues mirabegron  50mg  (now $75/month and cost prohibitive) due to reduced urinary urgency Pelvic floor PT discontinued due to cost SUI 1x/day, increased since starting mirabegron  Bladder discomfort worsens with increased activity reduced to 2-3/10 with reduce urinary urgency sensation  Day time voids 6-8.  Nocturia: 1 times/night Tried bladder instillation x 1 with minimal relief, noted bladder spasm Desires to continue discussion regarding medication treatments for endometriosis, would like to postpone discussion for surgical intervention   Avoiding EtOH and acidity with relief, previously uses cimetidine  PRN flares from dietary triggers however pt denies recollection of use Uses psyllium husk 1 tablespoon 4-5x/week  Past Medical History: Patient  has a past medical history of Migraine and Sleep apnea.   Past Surgical History: She  has a past surgical history that includes Carpal tunnel release (12/2023) and Intrauterine device insertion.   Medications: She has a current medication list which includes the following prescription(s): vitamin c with rose hips, bupropion , bupropion , diclofenac  sodium, magnesium gluconate, multivitamin-lutein, fish oil, ondansetron , OVER THE COUNTER MEDICATION, phenazopyridine , sumatriptan , trospium  chloride, and cimetidine .   Allergies: Patient is allergic to latex.   Social History: Patient  reports that she quit smoking about 16 years ago. Her smoking use included cigarettes. She started smoking about 21 years ago. She has been  exposed to tobacco smoke. She has never used smokeless tobacco. She reports current alcohol use of about 1.0 standard drink of alcohol per week. She reports that she does not use drugs.     OBJECTIVE     Physical Exam: Vitals:   12/26/24 1032  BP: 128/86  Pulse: 76    Gen: No apparent distress, A&O x 3.  Physical Exam Constitutional:      General: She is not in acute distress.    Appearance: Normal appearance.  Genitourinary:     Bladder and urethral meatus normal.     No lesions in the vagina.     Right Labia: No rash, tenderness, lesions, skin changes or Bartholin's cyst.    Left Labia: No tenderness, lesions, skin changes, Bartholin's cyst or rash.    No vaginal discharge, erythema, tenderness, bleeding, ulceration or granulation tissue.     Anterior vaginal prolapse present.    No vaginal atrophy present.    Urethral meatus caruncle not present.    No urethral prolapse, tenderness, mass, hypermobility, discharge or stress urinary incontinence with cough stress test present.     Bladder is tender and urgency on palpation.     Bladder masses not present.      Levator ani not tender, obturator internus not tender, no asymmetrical contractions present and no pelvic spasms present. Cardiovascular:     Rate and Rhythm: Normal rate.  Pulmonary:     Effort: Pulmonary effort is normal. No respiratory distress.  Neurological:     Mental Status: She is alert.  Vitals reviewed. Exam conducted with a chaperone present.      Detailed Urogynecologic Evaluation:  Deferred. Prior exam showed:  POP-Q  -2  Aa   -2                                           Ba  -7                                              C   1                                            Gh  4                                            Pb  9                                            tvl   -3                                            Ap  -3                                             Bp  -8                                              D        ASSESSMENT AND PLAN    [name] is a 44 y.o. with:  1. OAB (overactive bladder)   2. Bladder pain syndrome   3. Pelvic pain      OAB (overactive bladder) Assessment & Plan: - We discussed the symptoms of overactive bladder (OAB), which include urinary urgency, urinary frequency, nocturia, with or without urge incontinence.  While we do not know the exact etiology of OAB, several treatment options exist. We discussed management including behavioral therapy (decreasing bladder irritants, urge suppression strategies, timed voids, bladder retraining), physical therapy, medication; for refractory cases posterior tibial nerve stimulation, sacral neuromodulation, and intravesical botulinum toxin injection.  For anticholinergic medications, we discussed the potential side effects of anticholinergics including dry eyes, dry mouth, constipation, cognitive impairment and urinary retention. For Beta-3 agonist medication, we discussed the potential side effect of elevated blood pressure which is more likely to occur in individuals with uncontrolled hypertension. - mirabegron   50mg  with relief, cost prohibitive - Rx changed to Trospium  60mg  daily - [facility] cost prohibitive, referral to [facility]  Orders: -     Trospium  Chloride ER; Take 1 capsule (60 mg total) by mouth daily.  Dispense: 30 capsule; Refill: 2 -     AMB referral to rehabilitation  Bladder  pain syndrome Assessment & Plan: - urgency with palpation of bladder and pubic symphysis on repeat exam with history of migraines and anal fissures  - triggered by caffeine, some relief with NSAIDs, heat, and amitriptyline. Discussed Prelief PRN dietary triggers - underwent [facility] x 2 cost prohibitive, referral to [facility] - For irritative bladder we reviewed treatment options including altering her diet to avoid irritative  beverages and foods as well as attempting to decrease stress and other exacerbating factors.  We also discussed using pyridium  and similar over-the-counter medications for pain relief as needed. We discussed the pentad of medications including Tums, an antihistamine such as Vistaril, amitriptyline, and L-arginine.  We also discussed in-office bladder instillations for pain flares, as well as cystoscopy with hydrodistention in the operating room, which can be both diagnostic and therapeutic.  - Rx to resume cimetidine  at bedtime PRN dietary triggers and titrate up to 2 tabs/day as needed  - encouraged stress management and CAM therapies - encouraged to return if change in urinary symptoms to r/o UTI  - provided bladder instillations today, concerns with cost due to copay - discussed overlapping symptomatology with OAB and rUTI - prior improvement from Mirabegron  50mg  daily, reassess symptoms after transition to Trospium   Orders: -     Cimetidine ; Take 1 tablet (200 mg total) by mouth at bedtime.  Dispense: 30 tablet; Refill: 2 -     Trospium  Chloride ER; Take 1 capsule (60 mg total) by mouth daily.  Dispense: 30 capsule; Refill: 2 -     AMB referral to rehabilitation -     Heparin  Sodium (Porcine) -     Sodium Bicarbonate  -     BUPivacaine  HCl -     Lidocaine  HCl  Pelvic pain Assessment & Plan: - most bothersome symptom - reports pubic symphysis discomfort with pregnancy, underwent pelvic floor for pain and incontinence with some relief - pain with palpation of R ilioinguinal nerve distribution - The origin of pelvic pain can be multifactorial, including primary, reactive to a different pain source, nerve irritation, trauma, or even part of a centralized pain syndrome.Treatment options include pelvic floor physical therapy, stretching topical or oral pain medication, trigger point injections or centrally acting pain medications.   - encouraged abdominal stretching  - referral to [facility] and resume exercises - Rx Voltaren  gel with minimal use, encouraged to use as needed for pain - continue NSAIDs, heat for pain relief - encouraged sleeping with pillow between her legs and abdominal/pelvic binder for pain with ambulation  - resume Cimetidine  for bladder pain - established care with Dr. Glennon, desires to further discuss medical treatment of endometriosis. Message sent to Dr. Glennon for possible Hart   Orders: -     Cimetidine ; Take 1 tablet (200 mg total) by mouth at bedtime.  Dispense: 30 tablet; Refill: 2 -     AMB referral to rehabilitation   Time spent: I spent 21 minutes dedicated to the care of this patient on the date of this encounter to include pre-visit review of records, face-to-face time with the patient discussing pelvic pain, bladder pain, OAB, and post visit documentation and ordering medication/ testing.   Lianne ONEIDA Gillis, MD

## 2024-12-26 NOTE — Assessment & Plan Note (Addendum)
-   most bothersome symptom - reports pubic symphysis discomfort with pregnancy, underwent pelvic floor for pain and incontinence with some relief - pain with palpation of R ilioinguinal nerve distribution - The origin of pelvic pain can be multifactorial, including primary, reactive to a different pain source, nerve irritation, trauma, or even part of a centralized pain syndrome.Treatment options include pelvic floor physical therapy, stretching topical or oral pain medication, trigger point injections or centrally acting pain medications.   - encouraged abdominal stretching  - referral to [facility] and resume exercises - Rx Voltaren  gel with minimal use, encouraged to use as needed for pain - continue NSAIDs, heat for pain relief - encouraged sleeping with pillow between her legs and abdominal/pelvic binder for pain with ambulation  - resume Cimetidine  for bladder pain - established care with Dr. Glennon, desires to further discuss medical treatment of endometriosis. Message sent to Dr. Glennon for possible Hart

## 2025-03-06 ENCOUNTER — Encounter: Admitting: Family Medicine
